# Patient Record
Sex: Male | Born: 1965 | Race: White | Hispanic: No | Marital: Married | Smoking: Never smoker
Health system: Southern US, Community
[De-identification: ages and names within clinical notes are randomized; demographics above are authoritative.]

## PROBLEM LIST (undated history)

## (undated) HISTORY — PX: VASECTOMY: SHX75

---

## 2007-02-04 ENCOUNTER — Emergency Department: Payer: Self-pay | Admitting: Emergency Medicine

## 2008-02-07 DIAGNOSIS — E7800 Pure hypercholesterolemia, unspecified: Secondary | ICD-10-CM | POA: Insufficient documentation

## 2010-05-24 ENCOUNTER — Emergency Department: Payer: Self-pay | Admitting: Emergency Medicine

## 2012-07-20 LAB — TSH: TSH: 1.45 u[IU]/mL (ref 0.41–5.90)

## 2012-07-20 LAB — CBC AND DIFFERENTIAL
HCT: 46 % (ref 41–53)
HEMOGLOBIN: 16.2 g/dL (ref 13.5–17.5)
NEUTROS ABS: 57 /uL
PLATELETS: 246 10*3/uL (ref 150–399)
WBC: 4.9 10*3/mL

## 2012-07-20 LAB — BASIC METABOLIC PANEL
BUN: 14 mg/dL (ref 4–21)
Creatinine: 1 mg/dL (ref 0.6–1.3)
Glucose: 105 mg/dL
Potassium: 4.3 mmol/L (ref 3.4–5.3)
Sodium: 144 mmol/L (ref 137–147)

## 2012-07-20 LAB — HEPATIC FUNCTION PANEL
ALK PHOS: 58 U/L (ref 25–125)
ALT: 48 U/L — AB (ref 10–40)
AST: 34 U/L (ref 14–40)
Bilirubin, Total: 0.6 mg/dL

## 2012-07-20 LAB — LIPID PANEL
CHOLESTEROL: 213 mg/dL — AB (ref 0–200)
HDL: 51 mg/dL (ref 35–70)
LDL CALC: 147 mg/dL
TRIGLYCERIDES: 74 mg/dL (ref 40–160)

## 2015-07-01 DIAGNOSIS — L639 Alopecia areata, unspecified: Secondary | ICD-10-CM | POA: Insufficient documentation

## 2015-07-02 ENCOUNTER — Encounter: Payer: Self-pay | Admitting: Family Medicine

## 2015-07-02 ENCOUNTER — Other Ambulatory Visit (INDEPENDENT_AMBULATORY_CARE_PROVIDER_SITE_OTHER): Payer: BC Managed Care – PPO

## 2015-07-02 ENCOUNTER — Ambulatory Visit (INDEPENDENT_AMBULATORY_CARE_PROVIDER_SITE_OTHER): Payer: BC Managed Care – PPO | Admitting: Family Medicine

## 2015-07-02 VITALS — BP 130/82 | Temp 98.9°F | Resp 16 | Ht 74.0 in | Wt 225.0 lb

## 2015-07-02 DIAGNOSIS — E7800 Pure hypercholesterolemia, unspecified: Secondary | ICD-10-CM

## 2015-07-02 DIAGNOSIS — L639 Alopecia areata, unspecified: Secondary | ICD-10-CM | POA: Diagnosis not present

## 2015-07-02 DIAGNOSIS — Z0000 Encounter for general adult medical examination without abnormal findings: Secondary | ICD-10-CM

## 2015-07-02 LAB — POCT URINALYSIS DIPSTICK
BILIRUBIN UA: NEGATIVE
Glucose, UA: NEGATIVE
Ketones, UA: NEGATIVE
Leukocytes, UA: NEGATIVE
Nitrite, UA: NEGATIVE
PH UA: 7.5
Protein, UA: NEGATIVE
RBC UA: NEGATIVE
SPEC GRAV UA: 1.01
Urobilinogen, UA: 0.2

## 2015-07-02 NOTE — Patient Instructions (Signed)

## 2015-07-02 NOTE — Progress Notes (Signed)
Patient ID: Carl Haney, male   DOB: 03-10-1966, 50 y.o.   MRN: [id number]       Patient: Carl Haney, Male    DOB: 1965/11/30, 50 y.o.   MRN: [id number] Visit Date: 07/02/2015  Today's Provider: Vernie Murders, PA   Chief Complaint  Patient presents with  . Annual Exam  . Numbness    X 4-5 months.    Subjective:    Annual physical exam Carl Haney is a 50 y.o. male who presents today for health maintenance and complete physical. He feels fairly well. He reports exercising not regularly. He reports he is sleeping well.   Tdap- 07/02/2010 Numbness and tingling: Patient reports that he has had numbness and tingling in his left arm for about 1 month. Patient reports that it radiates down to his fingers. Patient denies any chest pain. However, he does report that he has an occasional chest tightness.   Review of Systems  Constitutional: Negative.   HENT: Negative.   Eyes: Negative.   Respiratory: Negative.   Cardiovascular: Negative.   Gastrointestinal: Negative.   Endocrine: Negative.   Genitourinary: Negative.   Musculoskeletal: Negative.   Skin: Negative.   Allergic/Immunologic: Negative.   Neurological: Positive for numbness.  Hematological: Negative.   Psychiatric/Behavioral: Negative.     Social History      He  reports that he has never smoked. He has never used smokeless tobacco. He reports that he drinks alcohol. He reports that he does not use illicit drugs.       Social History   Social History  . Marital Status: Married    Spouse Name: N/A  . Number of Children: N/A  . Years of Education: N/A   Social History Main Topics  . Smoking status: Never Smoker   . Smokeless tobacco: Never Used  . Alcohol Use: 0.0 oz/week    0 Standard drinks or equivalent per week     Comment: Occasionally  . Drug Use: No  . Sexual Activity: Not Asked   Other Topics Concern  . None   Social History Narrative    History reviewed. No pertinent past medical  history.   Patient Active Problem List   Diagnosis Date Noted  . AA (alopecia areata) 07/01/2015  . Pure hypercholesterolemia 02/07/2008  . Family history of sudden cardiac death (SCD) 11-14-2007    Past Surgical History  Procedure Laterality Date  . Vasectomy     Family History        Family Status  Relation Status Death Age  . Mother Alive   . Father Alive   . Sister Alive   . Maternal Grandmother Deceased   . Maternal Grandfather Deceased   . Paternal Grandmother Deceased   . Paternal Grandfather Deceased   . Sister Alive         His family history includes CAD in his father; Dementia in his maternal grandmother; Diabetes in his father; Heart attack in his maternal grandfather and paternal grandfather; Hypertension in his father.    No Known Allergies  Previous Medications   PRAVASTATIN (PRAVACHOL) 40 MG TABLET    Take by mouth. Reported on 07/02/2015    Patient Care Team: Margo Common, PA as PCP - General (Family Medicine)     Objective:   Vitals: BP 130/82 mmHg  Temp(Src) 98.9 F (37.2 C)  Resp 16  Ht 6\' 2"  (1.88 m)  Wt 225 lb (102.059 kg)  BMI 28.88 kg/m2    Physical Exam  Constitutional: He is oriented to person, place, and time. He appears well-developed and well-nourished.  HENT:  Head: Normocephalic and atraumatic.  Right Ear: External ear normal.  Left Ear: External ear normal.  Nose: Nose normal.  Mouth/Throat: Oropharynx is clear and moist.  Eyes: Conjunctivae and EOM are normal. Pupils are equal, round, and reactive to light. Right eye exhibits no discharge.  Neck: Normal range of motion. Neck supple. No tracheal deviation present. No thyromegaly present.  Cardiovascular: Normal rate, regular rhythm, normal heart sounds and intact distal pulses.   No murmur heard. Pulmonary/Chest: Effort normal and breath sounds normal. No respiratory distress. He has no wheezes. He has no rales. He exhibits no tenderness.  Abdominal: Soft. He exhibits  no distension and no mass. There is no tenderness. There is no rebound and no guarding.  Genitourinary: Rectum normal, prostate normal and penis normal. Guaiac negative stool.  Musculoskeletal: Normal range of motion. He exhibits no edema or tenderness.  Lymphadenopathy:    He has no cervical adenopathy.  Neurological: He is alert and oriented to person, place, and time. He has normal reflexes. No cranial nerve deficit. He exhibits normal muscle tone. Coordination normal.  Skin: Skin is warm and dry. No rash noted. No erythema.  Psychiatric: He has a normal mood and affect. His behavior is normal. Judgment and thought content normal.   Depression Screen PHQ 2/9 Scores 07/02/2015  PHQ - 2 Score 0   Assessment & Plan:     Routine Health Maintenance and Physical Exam  Exercise Activities and Dietary recommendations Goals    Walking for an hour with wife. Continue low fat diet.      Immunization History  Administered Date(s) Administered  . Tdap 07/02/2010    Health Maintenance  Topic Date Due  . HIV Screening  07/06/1981  . INFLUENZA VACCINE  11/26/2014  . TETANUS/TDAP  07/01/2020      Discussed health benefits of physical activity, and encouraged him to engage in regular exercise appropriate for his age and condition.    -------------------------------------------------------------------- 1. Annual physical exam Good general health. Immunizations up to date. Given anticipatory guidance about health, future immunizations and colonoscopy next year. Will screen for HIV and get routine labs. - HIV antibody (with reflex)  2. Pure hypercholesterolemia No longer taking the Pravastatin. BMI 28+ and should lose 15 lbs. Continue regular exercise and low fat diet. Recheck labs and follow up pending reports. - CBC with Differential/Platelet - Comprehensive metabolic panel - Lipid panel - TSH  3. AA (alopecia areata) No change in small bald spots on scalp (one on top and one  posterior - approximately 1x1.5 cm each). No discomfort or itch.

## 2015-07-04 ENCOUNTER — Telehealth: Payer: Self-pay

## 2015-07-04 LAB — CBC WITH DIFFERENTIAL/PLATELET
BASOS: 1 %
Basophils Absolute: 0 10*3/uL (ref 0.0–0.2)
EOS (ABSOLUTE): 0.1 10*3/uL (ref 0.0–0.4)
EOS: 3 %
HEMATOCRIT: 43 % (ref 37.5–51.0)
HEMOGLOBIN: 15.8 g/dL (ref 12.6–17.7)
IMMATURE GRANS (ABS): 0 10*3/uL (ref 0.0–0.1)
IMMATURE GRANULOCYTES: 0 %
Lymphocytes Absolute: 1.2 10*3/uL (ref 0.7–3.1)
Lymphs: 23 %
MCH: 31.8 pg (ref 26.6–33.0)
MCHC: 36.7 g/dL — AB (ref 31.5–35.7)
MCV: 87 fL (ref 79–97)
MONOS ABS: 0.4 10*3/uL (ref 0.1–0.9)
Monocytes: 9 %
NEUTROS ABS: 3.4 10*3/uL (ref 1.4–7.0)
NEUTROS PCT: 64 %
Platelets: 256 10*3/uL (ref 150–379)
RBC: 4.97 x10E6/uL (ref 4.14–5.80)
RDW: 13.2 % (ref 12.3–15.4)
WBC: 5.1 10*3/uL (ref 3.4–10.8)

## 2015-07-04 LAB — COMPREHENSIVE METABOLIC PANEL
A/G RATIO: 1.7 (ref 1.1–2.5)
ALBUMIN: 4.5 g/dL (ref 3.5–5.5)
ALT: 30 IU/L (ref 0–44)
AST: 22 IU/L (ref 0–40)
Alkaline Phosphatase: 63 IU/L (ref 39–117)
BUN / CREAT RATIO: 11 (ref 9–20)
BUN: 12 mg/dL (ref 6–24)
Bilirubin Total: 0.9 mg/dL (ref 0.0–1.2)
CALCIUM: 9.4 mg/dL (ref 8.7–10.2)
CO2: 24 mmol/L (ref 18–29)
CREATININE: 1.11 mg/dL (ref 0.76–1.27)
Chloride: 102 mmol/L (ref 96–106)
GFR, EST AFRICAN AMERICAN: 90 mL/min/{1.73_m2} (ref >59)
GFR, EST NON AFRICAN AMERICAN: 78 mL/min/{1.73_m2} (ref >59)
GLOBULIN, TOTAL: 2.6 g/dL (ref 1.5–4.5)
Glucose: 93 mg/dL (ref 65–99)
POTASSIUM: 4.8 mmol/L (ref 3.5–5.2)
SODIUM: 143 mmol/L (ref 134–144)
Total Protein: 7.1 g/dL (ref 6.0–8.5)

## 2015-07-04 LAB — TSH: TSH: 1.11 u[IU]/mL (ref 0.450–4.500)

## 2015-07-04 LAB — LIPID PANEL
CHOL/HDL RATIO: 5.3 ratio — AB (ref 0.0–5.0)
Cholesterol, Total: 234 mg/dL — ABNORMAL HIGH (ref 100–199)
HDL: 44 mg/dL (ref >39)
LDL CALC: 172 mg/dL — AB (ref 0–99)
Triglycerides: 92 mg/dL (ref 0–149)
VLDL Cholesterol Cal: 18 mg/dL (ref 5–40)

## 2015-07-04 LAB — HIV ANTIBODY (ROUTINE TESTING W REFLEX): HIV Screen 4th Generation wRfx: NONREACTIVE

## 2015-07-04 NOTE — Telephone Encounter (Signed)
-----   Message from [place], [place] sent at 07/04/2015  1:42 PM EST ----- All tests normal except total cholesterol and LDL cholesterol higher than last check. Need to be back on the Pravastatin 40 mg qd and recheck progress in 3 months.

## 2015-07-04 NOTE — Telephone Encounter (Signed)
LMTCB

## 2015-07-08 MED ORDER — PRAVASTATIN SODIUM 40 MG PO TABS: 40.0000 mg | ORAL_TABLET | Freq: Every day | ORAL | Status: DC

## 2015-07-08 NOTE — Telephone Encounter (Signed)
Patient advised as directed below. Patient verbalized understanding. Patient requested a refill on Pravastatin be sent to pharmacy. RX sent to [facility].

## 2015-12-04 ENCOUNTER — Other Ambulatory Visit: Payer: Self-pay | Admitting: Family Medicine

## 2015-12-04 NOTE — Telephone Encounter (Signed)
Patient requesting refill. Patient is due for recheck on labs. Last labs 07/03/15 and was recommended to recheck in 3 months.

## 2015-12-04 NOTE — Telephone Encounter (Signed)
Pt contacted office for refill request on the following medications: PRAVASTATIN 40MG  TABLETS.  [place].  CB#[phone]/MW

## 2015-12-05 MED ORDER — PRAVASTATIN SODIUM 40 MG PO TABS: 40.0000 mg | ORAL_TABLET | Freq: Every day | ORAL | 0 refills | Status: DC

## 2016-01-16 ENCOUNTER — Telehealth: Payer: Self-pay | Admitting: Family Medicine

## 2016-01-16 NOTE — Telephone Encounter (Signed)
Please review-aa 

## 2016-01-16 NOTE — Telephone Encounter (Signed)
Pt contacted office for refill request on the following medications:  pravastatin (PRAVACHOL) 40 MG tablet.  [place].  CB#[phone]/MW

## 2016-01-17 ENCOUNTER — Other Ambulatory Visit: Payer: Self-pay | Admitting: Family Medicine

## 2016-01-17 MED ORDER — PRAVASTATIN SODIUM 40 MG PO TABS: 40.0000 mg | ORAL_TABLET | Freq: Every day | ORAL | 6 refills | Status: DC

## 2016-01-17 NOTE — Telephone Encounter (Signed)
Refilled medications. Remind patient he is due for follow up of labs to assess progress.

## 2016-01-17 NOTE — Telephone Encounter (Signed)
Wife advised to let patient know-aa

## 2016-09-08 ENCOUNTER — Encounter: Payer: Self-pay | Admitting: Family Medicine

## 2016-09-08 ENCOUNTER — Ambulatory Visit (INDEPENDENT_AMBULATORY_CARE_PROVIDER_SITE_OTHER): Payer: BC Managed Care – PPO | Admitting: Family Medicine

## 2016-09-08 VITALS — BP 126/82 | HR 84 | Temp 98.2°F | Resp 16 | Ht 73.0 in | Wt 231.0 lb

## 2016-09-08 DIAGNOSIS — E7800 Pure hypercholesterolemia, unspecified: Secondary | ICD-10-CM | POA: Diagnosis not present

## 2016-09-08 DIAGNOSIS — Z1211 Encounter for screening for malignant neoplasm of colon: Secondary | ICD-10-CM | POA: Diagnosis not present

## 2016-09-08 DIAGNOSIS — M545 Low back pain, unspecified: Secondary | ICD-10-CM

## 2016-09-08 DIAGNOSIS — Z0000 Encounter for general adult medical examination without abnormal findings: Secondary | ICD-10-CM | POA: Diagnosis not present

## 2016-09-08 DIAGNOSIS — Z125 Encounter for screening for malignant neoplasm of prostate: Secondary | ICD-10-CM

## 2016-09-08 DIAGNOSIS — Z8241 Family history of sudden cardiac death: Secondary | ICD-10-CM | POA: Diagnosis not present

## 2016-09-08 MED ORDER — CYCLOBENZAPRINE HCL 5 MG PO TABS
5.0000 mg | ORAL_TABLET | Freq: Three times a day (TID) | ORAL | 1 refills | Status: DC | PRN
Start: 2016-09-08 — End: 2017-10-11

## 2016-09-08 NOTE — Progress Notes (Signed)
Patient: Carl Haney, Male    DOB: [birth year], 51 y.o.   MRN: [id number] Visit Date: 09/08/2016  Today's Provider: Vernie Murders, PA   Chief Complaint  Patient presents with  . Annual Exam   Subjective:    Annual physical exam Carl Haney is a 51 y.o. male who presents today for health maintenance and complete physical. He feels fairly well. He is c/o lower right back pain that radiates into his right leg x 3 weeks. He reports exercising once weekly for 1 hour; walks. He reports he is sleeping well.  Tdap- 07/02/2010 Due for colonoscopy  -----------------------------------------------------------------  Lipid/Cholesterol, Follow-up:   Last seen for this 1 years ago.  Management changes since that visit include restarting Pravastatin 40 mg. . Last Lipid Panel:    Component Value Date/Time   CHOL 234 (H) 07/03/2015 1418   TRIG 92 07/03/2015 1418   HDL 44 07/03/2015 1418   CHOLHDL 5.3 (H) 07/03/2015 1418   LDLCALC 172 (H) 07/03/2015 1418    He reports excellent compliance with treatment. He is not having side effects.  Weight trend: increasing steadily Current diet: in general, a "healthy" diet   Current exercise: walking   Wt Readings from Last 3 Encounters:  09/08/16 231 lb (104.8 kg)  07/02/15 225 lb (102.1 kg)    -------------------------------------------------------------------   Review of Systems  Constitutional: Negative.   HENT: Negative.   Eyes: Negative.   Respiratory: Negative.   Cardiovascular: Negative.   Gastrointestinal: Negative.   Endocrine: Negative.   Genitourinary: Negative.   Musculoskeletal: Positive for back pain (right lower back pain x 3 weeks; radiates into right leg). Negative for arthralgias, gait problem, joint swelling, myalgias, neck pain and neck stiffness.  Skin: Negative.   Allergic/Immunologic: Negative.   Neurological: Negative.   Hematological: Negative.   Psychiatric/Behavioral: Negative.     Social  History      He  reports that he has never smoked. He has never used smokeless tobacco. He reports that he drinks alcohol. He reports that he does not use drugs.       Social History   Social History  . Marital status: Married    Spouse name: Museum/gallery conservator  . Number of children: 2  . Years of education: college   Occupational History  .  [place] History Main Topics  . Smoking status: Never Smoker  . Smokeless tobacco: Never Used  . Alcohol use 0.0 oz/week     Comment: Occasionally  . Drug use: No  . Sexual activity: Yes   Other Topics Concern  . None   Social History Narrative  . None   History reviewed. No pertinent past medical history.  Patient Active Problem List   Diagnosis Date Noted  . AA (alopecia areata) 07/01/2015  . Pure hypercholesterolemia 02/07/2008  . Family history of sudden cardiac death (SCD) Nov 24, 2007    Past Surgical History:  Procedure Laterality Date  . VASECTOMY      Family History        Family Status  Relation Status  . Mother Alive  . Father Alive  . Sister Alive  . MGM Deceased  . MGF Deceased  . PGM Deceased  . PGF Deceased  . Sister Alive        His family history includes CAD in his father; Dementia in his maternal grandmother; Diabetes in his father; Healthy in his mother, sister, and sister; Heart attack in his maternal grandfather  and paternal grandfather; Hypertension in his father.     No Known Allergies   Current Outpatient Prescriptions:  .  pravastatin (PRAVACHOL) 40 MG tablet, Take 1 tablet (40 mg total) by mouth daily. DUE FOR RECHECK OF BLOOD CHEMISTRY AND CHOLESTEROL., Disp: 30 tablet, Rfl: 6   Patient Care Team: Samiha Denapoli, Vickki Muff, PA as PCP - General (Family Medicine)      Objective:   Vitals: BP 126/82 (BP Location: Right Arm, Patient Position: Sitting, Cuff Size: Large)   Pulse 84   Temp 98.2 F (36.8 C) (Oral)   Resp 16   Ht 6\' 1"  (1.854 m)   Wt 231 lb (104.8 kg)   BMI 30.48 kg/m     Vitals:   09/08/16 1512  BP: 126/82  Pulse: 84  Resp: 16  Temp: 98.2 F (36.8 C)  TempSrc: Oral  Weight: 231 lb (104.8 kg)  Height: 6\' 1"  (1.854 m)     Physical Exam  Constitutional: He is oriented to person, place, and time. He appears well-developed and well-nourished.  HENT:  Head: Normocephalic and atraumatic.  Right Ear: External ear normal.  Left Ear: External ear normal.  Nose: Nose normal.  Mouth/Throat: Oropharynx is clear and moist.  Eyes: Conjunctivae and EOM are normal. Pupils are equal, round, and reactive to light. Right eye exhibits no discharge.  Neck: Normal range of motion. Neck supple. No tracheal deviation present. No thyromegaly present.  Cardiovascular: Normal rate, regular rhythm, normal heart sounds and intact distal pulses.   No murmur heard. Pulmonary/Chest: Effort normal and breath sounds normal. No respiratory distress. He has no wheezes. He has no rales. He exhibits no tenderness.  Abdominal: Soft. He exhibits no distension and no mass. There is no tenderness. There is no rebound and no guarding.  Genitourinary: Rectum normal, prostate normal and penis normal.  Musculoskeletal: Normal range of motion. He exhibits tenderness. He exhibits no edema.  Tenderness in the right lower lumbar region with radiation to buttock. No numbness or weakness in legs. Increased pain and spasm with SLR's to 80 degrees (worse with dorsiflexion of right foot).  Lymphadenopathy:    He has no cervical adenopathy.  Neurological: He is alert and oriented to person, place, and time. No cranial nerve deficit. He exhibits normal muscle tone. Coordination normal.  DTR's diminished throughout upper and lower extremities.  Skin: Skin is warm and dry. No rash noted. No erythema.  Psychiatric: He has a normal mood and affect. His behavior is normal. Judgment and thought content normal.    Depression Screen PHQ 2/9 Scores 09/08/2016 07/02/2015  PHQ - 2 Score 0 0  PHQ- 9 Score 0 -     Assessment & Plan:     Routine Health Maintenance and Physical Exam  Exercise Activities and Dietary recommendations Goals    Walking 3-4 times a week for 30 minutes as back spasms allow.      Immunization History  Administered Date(s) Administered  . Td 02/04/2007  . Tdap 07/02/2010    Health Maintenance  Topic Date Due  . COLONOSCOPY  07/06/2016  . INFLUENZA VACCINE  11/25/2016  . TETANUS/TDAP  07/01/2020  . HIV Screening  Completed     Discussed health benefits of physical activity, and encouraged him to engage in regular exercise appropriate for his age and condition.    -------------------------------------------------------------------- 1. Annual physical exam Stable and good general health except weight gain. Recommend weight loss by regular exercise and calorie reduction diet. Immunizations up to date. Given anticipatory  guidance. - CBC with Differential/Platelet - Comprehensive metabolic panel  2. Colon cancer screening No hematochezia, melena or abdominal pains. - Ambulatory referral to Gastroenterology  3. Screening PSA (prostate specific antigen) Slight dribbling after urinating. No nocturia of significance. Check PSA. - PSA  4. Pure hypercholesterolemia Tolerating Pravastatin 40 mg qd and low fat diet. Has gained 6 lbs since last office visit. Recheck labs. - Comprehensive metabolic panel - Lipid panel - TSH  5. Family history of sudden cardiac death (SCD) EKG shows normal sinus rhythm. No signs of acute changes. Denies chest pains or palpitations. Sudden heart attacks in his MGF and PGF. - EKG 12-Lead  6. Acute right-sided low back pain without sciatica Right lower lumbar muscle spasms over the past 3 weeks. No specific injury known. No numbness or pain radiating into the right lower leg or foot. Apply moist heat and given Flexeril prn spasm. May need x-ray evaluation and physical therapy if no better in 5-7 days. - cyclobenzaprine (FLEXERIL)  5 MG tablet; Take 1 tablet (5 mg total) by mouth 3 (three) times daily as needed for muscle spasms.  Dispense: 30 tablet; Refill: [place], [facility]

## 2016-09-10 LAB — CBC WITH DIFFERENTIAL/PLATELET
BASOS ABS: 0 10*3/uL (ref 0.0–0.2)
Basos: 1 %
EOS (ABSOLUTE): 0.2 10*3/uL (ref 0.0–0.4)
EOS: 5 %
HEMATOCRIT: 43.3 % (ref 37.5–51.0)
HEMOGLOBIN: 15.3 g/dL (ref 13.0–17.7)
IMMATURE GRANS (ABS): 0 10*3/uL (ref 0.0–0.1)
Immature Granulocytes: 0 %
LYMPHS: 24 %
Lymphocytes Absolute: 1.1 10*3/uL (ref 0.7–3.1)
MCH: 31 pg (ref 26.6–33.0)
MCHC: 35.3 g/dL (ref 31.5–35.7)
MCV: 88 fL (ref 79–97)
MONOCYTES: 10 %
Monocytes Absolute: 0.4 10*3/uL (ref 0.1–0.9)
Neutrophils Absolute: 2.7 10*3/uL (ref 1.4–7.0)
Neutrophils: 60 %
Platelets: 236 10*3/uL (ref 150–379)
RBC: 4.94 x10E6/uL (ref 4.14–5.80)
RDW: 14.2 % (ref 12.3–15.4)
WBC: 4.4 10*3/uL (ref 3.4–10.8)

## 2016-09-10 LAB — COMPREHENSIVE METABOLIC PANEL
ALBUMIN: 4.5 g/dL (ref 3.5–5.5)
ALK PHOS: 59 IU/L (ref 39–117)
ALT: 24 IU/L (ref 0–44)
AST: 22 IU/L (ref 0–40)
Albumin/Globulin Ratio: 1.7 (ref 1.2–2.2)
BUN / CREAT RATIO: 10 (ref 9–20)
BUN: 11 mg/dL (ref 6–24)
Bilirubin Total: 1 mg/dL (ref 0.0–1.2)
CALCIUM: 9.4 mg/dL (ref 8.7–10.2)
CO2: 24 mmol/L (ref 18–29)
CREATININE: 1.05 mg/dL (ref 0.76–1.27)
Chloride: 106 mmol/L (ref 96–106)
GFR, EST AFRICAN AMERICAN: 95 mL/min/{1.73_m2} (ref >59)
GFR, EST NON AFRICAN AMERICAN: 82 mL/min/{1.73_m2} (ref >59)
GLOBULIN, TOTAL: 2.6 g/dL (ref 1.5–4.5)
GLUCOSE: 99 mg/dL (ref 65–99)
Potassium: 4.5 mmol/L (ref 3.5–5.2)
Sodium: 145 mmol/L — ABNORMAL HIGH (ref 134–144)
TOTAL PROTEIN: 7.1 g/dL (ref 6.0–8.5)

## 2016-09-10 LAB — TSH: TSH: 1.32 u[IU]/mL (ref 0.450–4.500)

## 2016-09-10 LAB — LIPID PANEL
CHOL/HDL RATIO: 4.7 ratio (ref 0.0–5.0)
CHOLESTEROL TOTAL: 174 mg/dL (ref 100–199)
HDL: 37 mg/dL — ABNORMAL LOW (ref >39)
LDL CALC: 121 mg/dL — AB (ref 0–99)
Triglycerides: 82 mg/dL (ref 0–149)
VLDL CHOLESTEROL CAL: 16 mg/dL (ref 5–40)

## 2016-09-10 LAB — PSA: Prostate Specific Ag, Serum: 0.8 ng/mL (ref 0.0–4.0)

## 2016-09-14 ENCOUNTER — Other Ambulatory Visit: Payer: Self-pay | Admitting: Family Medicine

## 2016-09-14 ENCOUNTER — Telehealth: Payer: Self-pay

## 2016-09-14 MED ORDER — PRAVASTATIN SODIUM 40 MG PO TABS: 40.0000 mg | ORAL_TABLET | Freq: Every day | ORAL | 2 refills | Status: DC

## 2016-09-14 NOTE — Telephone Encounter (Signed)
Last OV 09/08/2016. Renaldo Fiddler, CMA

## 2016-09-14 NOTE — Telephone Encounter (Signed)
-----   Message from Margo Common, [place] sent at 09/11/2016  1:32 PM EDT ----- LDL cholesterol better but still some elevation. Continue Pravastatin 40 mg qd and low fat diet. Remainder of blood tests are normal. Recheck cholesterol progress in 6 months.

## 2016-09-14 NOTE — Telephone Encounter (Signed)
Left message advising pt; ok per DPR. Tiaunna Buford Drozdowski, CMA  

## 2016-09-14 NOTE — Telephone Encounter (Signed)
Refill  pravastatin (PRAVACHOL) 40 MG tablet  [facility] [address]  Thank sTeri

## 2016-10-05 ENCOUNTER — Telehealth: Payer: Self-pay | Admitting: Gastroenterology

## 2016-10-05 ENCOUNTER — Other Ambulatory Visit: Payer: Self-pay

## 2016-10-05 DIAGNOSIS — Z1211 Encounter for screening for malignant neoplasm of colon: Secondary | ICD-10-CM

## 2016-10-05 NOTE — Telephone Encounter (Signed)
Gastroenterology Pre-Procedure Review  Request Date: 6/26 Requesting Physician: Dr. Vicente Males  PATIENT REVIEW QUESTIONS: The patient responded to the following health history questions as indicated:    1. Are you having any GI issues? no 2. Do you have a personal history of Polyps? no 3. Do you have a family history of Colon Cancer or Polyps? no 4. Diabetes Mellitus? no 5. Joint replacements in the past 12 months?no 6. Major health problems in the past 3 months?no 7. Any artificial heart valves, MVP, or defibrillator?no    MEDICATIONS & ALLERGIES:    Patient reports the following regarding taking any anticoagulation/antiplatelet therapy:   Plavix, Coumadin, Eliquis, Xarelto, Lovenox, Pradaxa, Brilinta, or Effient? no Aspirin? no  Patient confirms/reports the following medications:  Current Outpatient Prescriptions  Medication Sig Dispense Refill  . cyclobenzaprine (FLEXERIL) 5 MG tablet Take 1 tablet (5 mg total) by mouth 3 (three) times daily as needed for muscle spasms. 30 tablet 1  . pravastatin (PRAVACHOL) 40 MG tablet Take 1 tablet (40 mg total) by mouth daily. 30 tablet 2   No current facility-administered medications for this visit.     Patient confirms/reports the following allergies:  No Known Allergies  No orders of the defined types were placed in this encounter.   AUTHORIZATION INFORMATION Primary Insurance: 1D#: Group #:  Secondary Insurance: 1D#: Group #:  SCHEDULE INFORMATION: Date: 6/26 Time: Location: [place]

## 2016-10-05 NOTE — Telephone Encounter (Signed)
Patient is returning your call to schedule a colonoscopy °

## 2016-10-20 ENCOUNTER — Encounter: Payer: Self-pay | Admitting: *Deleted

## 2016-10-20 ENCOUNTER — Ambulatory Visit: Payer: BC Managed Care – PPO | Admitting: Anesthesiology

## 2016-10-20 ENCOUNTER — Encounter
Admission: RE | Disposition: A | Discharge status: Home / Self Care | Payer: Self-pay | Source: Ambulatory Visit | Attending: Gastroenterology

## 2016-10-20 ENCOUNTER — Ambulatory Visit
Admission: RE | Admit: 2016-10-20 | Discharge: 2016-10-20 | Disposition: A | Discharge status: Home / Self Care | Payer: BC Managed Care – PPO | Source: Ambulatory Visit | Attending: Gastroenterology | Admitting: Gastroenterology

## 2016-10-20 DIAGNOSIS — Z820 Family history of epilepsy and other diseases of the nervous system: Secondary | ICD-10-CM | POA: Insufficient documentation

## 2016-10-20 DIAGNOSIS — Z823 Family history of stroke: Secondary | ICD-10-CM | POA: Diagnosis not present

## 2016-10-20 DIAGNOSIS — Z79899 Other long term (current) drug therapy: Secondary | ICD-10-CM | POA: Diagnosis not present

## 2016-10-20 DIAGNOSIS — K640 First degree hemorrhoids: Secondary | ICD-10-CM | POA: Insufficient documentation

## 2016-10-20 DIAGNOSIS — Z1211 Encounter for screening for malignant neoplasm of colon: Secondary | ICD-10-CM | POA: Insufficient documentation

## 2016-10-20 DIAGNOSIS — Z8249 Family history of ischemic heart disease and other diseases of the circulatory system: Secondary | ICD-10-CM | POA: Insufficient documentation

## 2016-10-20 DIAGNOSIS — D125 Benign neoplasm of sigmoid colon: Secondary | ICD-10-CM | POA: Diagnosis not present

## 2016-10-20 DIAGNOSIS — Z833 Family history of diabetes mellitus: Secondary | ICD-10-CM | POA: Diagnosis not present

## 2016-10-20 HISTORY — PX: COLONOSCOPY WITH PROPOFOL: SHX5780

## 2016-10-20 SURGERY — COLONOSCOPY WITH PROPOFOL
Anesthesia: General

## 2016-10-20 MED ORDER — SODIUM CHLORIDE 0.9 % IV SOLN
INTRAVENOUS | Status: DC
  Administered 2016-10-20 (×2): 1000 mL via INTRAVENOUS

## 2016-10-20 MED ORDER — PROPOFOL 500 MG/50ML IV EMUL
INTRAVENOUS | Status: DC | PRN
  Administered 2016-10-20: 140 ug/kg/min via INTRAVENOUS

## 2016-10-20 MED ORDER — EPHEDRINE SULFATE 50 MG/ML IJ SOLN
INTRAMUSCULAR | Status: DC | PRN
  Administered 2016-10-20: 10 mg via INTRAVENOUS

## 2016-10-20 MED ORDER — MIDAZOLAM HCL 2 MG/2ML IJ SOLN
INTRAMUSCULAR | Status: AC
  Filled 2016-10-20: qty 2

## 2016-10-20 MED ORDER — FENTANYL CITRATE (PF) 100 MCG/2ML IJ SOLN
INTRAMUSCULAR | Status: AC
  Filled 2016-10-20: qty 2

## 2016-10-20 MED ORDER — PROPOFOL 500 MG/50ML IV EMUL
INTRAVENOUS | Status: AC
  Filled 2016-10-20: qty 50

## 2016-10-20 MED ORDER — FENTANYL CITRATE (PF) 100 MCG/2ML IJ SOLN
INTRAMUSCULAR | Status: DC | PRN
  Administered 2016-10-20 (×2): 50 ug via INTRAVENOUS

## 2016-10-20 MED ORDER — MIDAZOLAM HCL 2 MG/2ML IJ SOLN
INTRAMUSCULAR | Status: DC | PRN
  Administered 2016-10-20: 2 mg via INTRAVENOUS

## 2016-10-20 NOTE — Anesthesia Procedure Notes (Signed)
Performed by: COOK-MARTIN, Lanetra Hartley Pre-anesthesia Checklist: Patient identified, Emergency Drugs available, Suction available, Patient being monitored and Timeout performed Patient Re-evaluated:Patient Re-evaluated prior to inductionOxygen Delivery Method: Nasal cannula Preoxygenation: Pre-oxygenation with 100% oxygen Intubation Type: IV induction Placement Confirmation: positive ETCO2 and CO2 detector       

## 2016-10-20 NOTE — Transfer of Care (Signed)
Immediate Anesthesia Transfer of Care Note  Patient: Carl Haney  Procedure(s) Performed: Procedure(s): COLONOSCOPY WITH PROPOFOL (N/A)  Patient Location: PACU  Anesthesia Type:General  Level of Consciousness: awake and sedated  Airway & Oxygen Therapy: Patient Spontanous Breathing and Patient connected to nasal cannula oxygen  Post-op Assessment: Report given to RN and Post -op Vital signs reviewed and stable  Post vital signs: Reviewed and stable  Last Vitals:  Vitals:   10/20/16 0946  BP: (!) 135/94  Pulse: 65  Resp: 18  Temp: (!) 36.1 C    Last Pain:  Vitals:   10/20/16 0946  TempSrc: Tympanic         Complications: No apparent anesthesia complications

## 2016-10-20 NOTE — Anesthesia Post-op Follow-up Note (Cosign Needed)
Anesthesia QCDR form completed.        

## 2016-10-20 NOTE — Anesthesia Postprocedure Evaluation (Signed)
Anesthesia Post Note  Patient: Carl Haney  Procedure(s) Performed: Procedure(s) (LRB): COLONOSCOPY WITH PROPOFOL (N/A)  Patient location during evaluation: PACU Anesthesia Type: General Level of consciousness: awake Pain management: pain level controlled Vital Signs Assessment: post-procedure vital signs reviewed and stable Respiratory status: nonlabored ventilation Cardiovascular status: stable Anesthetic complications: no     Last Vitals:  Vitals:   10/20/16 0946 10/20/16 1107  BP: (!) 135/94   Pulse: 65   Resp: 18   Temp: (!) 36.1 C 36.1 C    Last Pain:  Vitals:   10/20/16 1107  TempSrc: Tympanic                 VAN STAVEREN,[name]

## 2016-10-20 NOTE — Op Note (Signed)
[facility] Gastroenterology Patient Name: Carl Haney Procedure Date: 10/20/2016 10:35 AM MRN: [id number] Account #: [id number] Date of Birth: 02/15/1966 Admit Type: Outpatient Age: 51 Room: [facility] ENDO ROOM 1 Gender: Male Note Status: Finalized Procedure:            Colonoscopy Indications:          Screening for colorectal malignant neoplasm Providers:            Jonathon Bellows MD, MD Referring MD:         Vickki Muff. Chrismon, MD (Referring MD) Medicines:            Monitored Anesthesia Care Complications:        No immediate complications. Procedure:            Pre-Anesthesia Assessment:                       - Prior to the procedure, a History and Physical was                        performed, and patient medications, allergies and                        sensitivities were reviewed. The patient's tolerance of                        previous anesthesia was reviewed.                       - The risks and benefits of the procedure and the                        sedation options and risks were discussed with the                        patient. All questions were answered and informed                        consent was obtained.                       - ASA Grade Assessment: I - A normal, healthy patient.                       After obtaining informed consent, the colonoscope was                        passed under direct vision. Throughout the procedure,                        the patient's blood pressure, pulse, and oxygen                        saturations were monitored continuously. The                        Colonoscope was introduced through the anus and                        advanced to the the cecum, identified by the  appendiceal orifice, IC valve and transillumination.                        The colonoscopy was performed with ease. The patient                        tolerated the procedure well. The quality of the bowel         preparation was good. Findings:      A 8 mm polyp was found in the sigmoid colon. The polyp was sessile. The       polyp was removed with a cold snare. Resection and retrieval were       complete.      Non-bleeding internal hemorrhoids were found during retroflexion. The       hemorrhoids were medium-sized and Grade I (internal hemorrhoids that do       not prolapse).      The exam was otherwise without abnormality on direct and retroflexion       views. Impression:           - One 8 mm polyp in the sigmoid colon, removed with a                        cold snare. Resected and retrieved.                       - Non-bleeding internal hemorrhoids.                       - The examination was otherwise normal on direct and                        retroflexion views. Recommendation:       - Discharge patient to home (with escort).                       - Resume previous diet.                       - Continue present medications.                       - Await pathology results.                       - Repeat colonoscopy in 5-10 years for surveillance. Procedure Code(s):    --- Professional ---                       [phone], Colonoscopy, flexible; with removal of tumor(s),                        polyp(s), or other lesion(s) by snare technique Diagnosis Code(s):    --- Professional ---                       Z12.11, Encounter for screening for malignant neoplasm                        of colon                       D12.5, Benign neoplasm of sigmoid colon  K64.0, First degree hemorrhoids CPT copyright 2016 American Medical Association. All rights reserved. The codes documented in this report are preliminary and upon coder review may  be revised to meet current compliance requirements. Jonathon Bellows, MD Jonathon Bellows MD, MD 10/20/2016 11:06:31 AM This report has been signed electronically. Number of Addenda: 0 Note Initiated On: 10/20/2016 10:35 AM Scope Withdrawal Time: 0  hours 13 minutes 41 seconds  Total Procedure Duration: 0 hours 21 minutes 22 seconds       [facility]

## 2016-10-20 NOTE — Anesthesia Preprocedure Evaluation (Signed)
Anesthesia Evaluation  Patient identified by MRN, date of birth, ID band Patient awake    Reviewed: Allergy & Precautions, H&P , NPO status , Patient's Chart, lab work & pertinent test results  History of Anesthesia Complications Negative for: history of anesthetic complications  Airway Mallampati: III  TM Distance: <3 FB Neck ROM: full    Dental  (+) Chipped   Pulmonary neg pulmonary ROS, neg shortness of breath,           Cardiovascular Exercise Tolerance: Good (-) angina(-) Past MI negative cardio ROS       Neuro/Psych negative neurological ROS  negative psych ROS   GI/Hepatic negative GI ROS, Neg liver ROS,   Endo/Other  negative endocrine ROS  Renal/GU negative Renal ROS  negative genitourinary   Musculoskeletal   Abdominal   Peds  Hematology negative hematology ROS (+)   Anesthesia Other Findings History reviewed. No pertinent past medical history.  Past Surgical History: No date: VASECTOMY  BMI    Body Mass Index:  28.89 kg/m      Reproductive/Obstetrics negative OB ROS                             Anesthesia Physical Anesthesia Plan  ASA: I  Anesthesia Plan: General   Post-op Pain Management:    Induction: Intravenous  PONV Risk Score and Plan:   Airway Management Planned: Natural Airway and Nasal Cannula  Additional Equipment:   Intra-op Plan:   Post-operative Plan:   Informed Consent: I have reviewed the patients History and Physical, chart, labs and discussed the procedure including the risks, benefits and alternatives for the proposed anesthesia with the patient or authorized representative who has indicated his/her understanding and acceptance.   Dental Advisory Given  Plan Discussed with: Anesthesiologist, CRNA and Surgeon  Anesthesia Plan Comments: (Patient consented for risks of anesthesia including but not limited to:  - adverse reactions to  medications - damage to teeth, lips or other oral mucosa - sore throat or hoarseness - Damage to heart, brain, lungs or loss of life  Patient voiced understanding.)        Anesthesia Quick Evaluation

## 2016-10-20 NOTE — H&P (Signed)
  Jonathon Bellows MD [address], [place], [place] [address] Phone: [phone] Fax : [phone]  Primary Care Physician:  Margo Common, [place] Primary Gastroenterologist:  Dr. Jonathon Bellows   Pre-Procedure History & Physical: HPI:  Carl Haney is a 51 y.o. male is here for an colonoscopy.   History reviewed. No pertinent past medical history.  Past Surgical History:  Procedure Laterality Date  . VASECTOMY      Prior to Admission medications   Medication Sig Start Date End Date Taking? Authorizing Provider  cyclobenzaprine (FLEXERIL) 5 MG tablet Take 1 tablet (5 mg total) by mouth 3 (three) times daily as needed for muscle spasms. 09/08/16  Yes Chrismon, Vickki Muff, PA  pravastatin (PRAVACHOL) 40 MG tablet Take 1 tablet (40 mg total) by mouth daily. 09/14/16  Yes Birdie Sons, MD    Allergies as of 10/05/2016  . (No Known Allergies)    Family History  Problem Relation Age of Onset  . Healthy Mother   . CAD Father   . Diabetes Father   . Hypertension Father   . Healthy Sister   . Dementia Maternal Grandmother   . Heart attack Maternal Grandfather   . Heart attack Paternal Grandfather   . Healthy Sister     Social History   Social History  . Marital status: Married    Spouse name: Museum/gallery conservator  . Number of children: 2  . Years of education: college   Occupational History  .  [place] History Main Topics  . Smoking status: Never Smoker  . Smokeless tobacco: Never Used  . Alcohol use 0.0 oz/week     Comment: Occasionally  . Drug use: No  . Sexual activity: Yes   Other Topics Concern  . Not on file   Social History Narrative  . No narrative on file    Review of Systems: See HPI, otherwise negative ROS  Physical Exam: BP (!) 135/94   Pulse 65   Temp (!) 96.9 F (36.1 C) (Tympanic)   Resp 18   Ht 6\' 2"  (1.88 m)   Wt 225 lb (102.1 kg)   SpO2 99%   BMI 28.89 kg/m  General:   Alert,  pleasant and cooperative in NAD Head:   Normocephalic and atraumatic. Neck:  Supple; no masses or thyromegaly. Lungs:  Clear throughout to auscultation.    Heart:  Regular rate and rhythm. Abdomen:  Soft, nontender and nondistended. Normal bowel sounds, without guarding, and without rebound.   Neurologic:  Alert and  oriented x4;  grossly normal neurologically.  Impression/Plan: Terese Door is here for an colonoscopy to be performed for Screening colonoscopy average risk    Risks, benefits, limitations, and alternatives regarding  colonoscopy have been reviewed with the patient.  Questions have been answered.  All parties agreeable.   Jonathon Bellows, MD  10/20/2016, 10:34 AM

## 2016-10-21 ENCOUNTER — Encounter: Payer: Self-pay | Admitting: Gastroenterology

## 2016-10-21 LAB — SURGICAL PATHOLOGY

## 2016-10-25 ENCOUNTER — Encounter: Payer: Self-pay | Admitting: Gastroenterology

## 2017-01-05 ENCOUNTER — Other Ambulatory Visit: Payer: Self-pay | Admitting: Family Medicine

## 2017-03-19 ENCOUNTER — Encounter: Payer: Self-pay | Admitting: Family Medicine

## 2017-03-19 ENCOUNTER — Ambulatory Visit: Payer: BC Managed Care – PPO | Admitting: Family Medicine

## 2017-03-19 VITALS — BP 128/78 | HR 71 | Temp 98.2°F | Wt 233.0 lb

## 2017-03-19 DIAGNOSIS — E7800 Pure hypercholesterolemia, unspecified: Secondary | ICD-10-CM

## 2017-03-19 NOTE — Progress Notes (Signed)
Patient: Carl Haney Male    DOB: August 06, 1965   51 y.o.   MRN: [id number] Visit Date: 03/19/2017  Today's Provider: Vernie Murders, PA   Chief Complaint  Patient presents with  . Hyperlipidemia  . Follow-up   Subjective:    HPI  Lipid/Cholesterol, Follow-up:   Last seen for this 6 months ago.  Management changes since that visit include continue medication and low fat diet. . Last Lipid Panel:    Component Value Date/Time   CHOL 174 09/09/2016 0812   TRIG 82 09/09/2016 0812   HDL 37 (L) 09/09/2016 0812   CHOLHDL 4.7 09/09/2016 0812   LDLCALC 121 (H) 09/09/2016 8921    He reports fair compliance with treatment. He is not having side effects.  Current symptoms include none  Weight trend: increasing steadily Current diet: in general, an "unhealthy" diet Current exercise: not as much as last follow up   Wt Readings from Last 3 Encounters:  03/19/17 233 lb (105.7 kg)  10/20/16 225 lb (102.1 kg)  09/08/16 231 lb (104.8 kg)    -------------------------------------------------------------------  Patient Active Problem List   Diagnosis Date Noted  . AA (alopecia areata) 07/01/2015  . Pure hypercholesterolemia 02/07/2008  . Family history of sudden cardiac death (SCD) 11/12/2007   Past Surgical History:  Procedure Laterality Date  . COLONOSCOPY WITH PROPOFOL N/A 10/20/2016   Procedure: COLONOSCOPY WITH PROPOFOL;  Surgeon: Jonathon Bellows, MD;  Location: [facility] ENDOSCOPY;  Service: Endoscopy;  Laterality: N/A;  . VASECTOMY     Family History  Problem Relation Age of Onset  . Healthy Mother   . CAD Father   . Diabetes Father   . Hypertension Father   . Healthy Sister   . Dementia Maternal Grandmother   . Heart attack Maternal Grandfather   . Heart attack Paternal Grandfather   . Healthy Sister    No Known Allergies  Current Outpatient Medications:  .  cyclobenzaprine (FLEXERIL) 5 MG tablet, Take 1 tablet (5 mg total) by mouth 3 (three) times daily as  needed for muscle spasms., Disp: 30 tablet, Rfl: 1 .  pravastatin (PRAVACHOL) 40 MG tablet, TAKE 1 TABLET(40 MG) BY MOUTH DAILY, Disp: 30 tablet, Rfl: 3  Review of Systems  Constitutional: Negative.   Respiratory: Negative.   Cardiovascular: Negative.   Musculoskeletal: Negative.     Social History   Tobacco Use  . Smoking status: Never Smoker  . Smokeless tobacco: Never Used  Substance Use Topics  . Alcohol use: Yes    Alcohol/week: 0.0 oz    Comment: Occasionally   Objective:   BP 128/78 (BP Location: Right Arm, Patient Position: Sitting, Cuff Size: Normal)   Pulse 71   Temp 98.2 F (36.8 C) (Oral)   Wt 233 lb (105.7 kg)   SpO2 97%   BMI 29.92 kg/m   Physical Exam  Constitutional: He is oriented to person, place, and time. He appears well-developed and well-nourished. No distress.  HENT:  Head: Normocephalic and atraumatic.  Right Ear: Hearing normal.  Left Ear: Hearing normal.  Nose: Nose normal.  Eyes: Conjunctivae and lids are normal. Right eye exhibits no discharge. Left eye exhibits no discharge. No scleral icterus.  Neck: Neck supple.  Cardiovascular: Normal rate and regular rhythm.  Pulmonary/Chest: Effort normal and breath sounds normal. No respiratory distress.  Abdominal: Soft. Bowel sounds are normal.  Musculoskeletal: Normal range of motion.  Neurological: He is alert and oriented to person, place, and time.  Skin:  Skin is intact. No lesion and no rash noted.  Psychiatric: He has a normal mood and affect. His speech is normal and behavior is normal. Thought content normal.      Assessment & Plan:     1. Pure hypercholesterolemia Tolerating Pravastatin 40 mg qd without side effects. Trying to follow a low fat diet but has gained 8 lbs since last visit. Encouraged to exercise and lower caloric intake. Recheck labs and follow up pending reports. - TSH - COMPLETE METABOLIC PANEL WITH GFR - Lipid panel       Vernie Murders, PA  [facility]

## 2017-03-20 LAB — COMPLETE METABOLIC PANEL WITH GFR
AG RATIO: 1.8 (calc) (ref 1.0–2.5)
ALBUMIN MSPROF: 4.2 g/dL (ref 3.6–5.1)
ALT: 23 U/L (ref 9–46)
AST: 20 U/L (ref 10–35)
Alkaline phosphatase (APISO): 60 U/L (ref 40–115)
BUN: 12 mg/dL (ref 7–25)
CO2: 29 mmol/L (ref 20–32)
CREATININE: 0.99 mg/dL (ref 0.70–1.33)
Calcium: 9.3 mg/dL (ref 8.6–10.3)
Chloride: 109 mmol/L (ref 98–110)
GFR, EST AFRICAN AMERICAN: 102 mL/min/{1.73_m2} (ref >=60)
GFR, EST NON AFRICAN AMERICAN: 88 mL/min/{1.73_m2} (ref >=60)
Globulin: 2.3 g/dL (calc) (ref 1.9–3.7)
Glucose, Bld: 110 mg/dL — ABNORMAL HIGH (ref 65–99)
Potassium: 4.4 mmol/L (ref 3.5–5.3)
SODIUM: 143 mmol/L (ref 135–146)
TOTAL PROTEIN: 6.5 g/dL (ref 6.1–8.1)
Total Bilirubin: 0.8 mg/dL (ref 0.2–1.2)

## 2017-03-20 LAB — LIPID PANEL
CHOL/HDL RATIO: 3.8 (calc) (ref <5.0)
Cholesterol: 176 mg/dL (ref <200)
HDL: 46 mg/dL (ref >40)
LDL CHOLESTEROL (CALC): 113 mg/dL — AB
NON-HDL CHOLESTEROL (CALC): 130 mg/dL — AB (ref <130)
Triglycerides: 78 mg/dL (ref <150)

## 2017-03-20 LAB — TSH: TSH: 0.95 m[IU]/L (ref 0.40–4.50)

## 2017-03-22 ENCOUNTER — Ambulatory Visit: Payer: BC Managed Care – PPO | Admitting: Family Medicine

## 2017-05-17 ENCOUNTER — Other Ambulatory Visit: Payer: Self-pay | Admitting: Family Medicine

## 2017-10-11 ENCOUNTER — Ambulatory Visit: Payer: BC Managed Care – PPO | Admitting: Family Medicine

## 2017-10-11 ENCOUNTER — Encounter: Payer: Self-pay | Admitting: Family Medicine

## 2017-10-11 VITALS — BP 120/78 | HR 65 | Temp 97.6°F | Ht 73.0 in | Wt 230.6 lb

## 2017-10-11 DIAGNOSIS — E7800 Pure hypercholesterolemia, unspecified: Secondary | ICD-10-CM

## 2017-10-11 DIAGNOSIS — Z0000 Encounter for general adult medical examination without abnormal findings: Secondary | ICD-10-CM | POA: Diagnosis not present

## 2017-10-11 DIAGNOSIS — L639 Alopecia areata, unspecified: Secondary | ICD-10-CM

## 2017-10-11 NOTE — Progress Notes (Signed)
Patient: Carl Haney Male    DOB: 06-03-1965   52 y.o.   MRN: [id number] Visit Date: 10/11/2017  Today's Provider: Vernie Murders, PA   Chief Complaint  Patient presents with  . Hyperlipidemia  . Follow-up   Subjective:    HPI  Lipid/Cholesterol, Follow-up:   Last seen for this 6 months ago.  Management changes since that visit include advised to continue medication, exercise and follow a low fat diet.  Last Lipid Panel:    Component Value Date/Time   CHOL 176 03/19/2017 1102   CHOL 174 09/09/2016 0812   TRIG 78 03/19/2017 1102   HDL 46 03/19/2017 1102   HDL 37 (L) 09/09/2016 0812   CHOLHDL 3.8 03/19/2017 1102   LDLCALC 113 (H) 03/19/2017 1102   He reports fair compliance with treatment. Patient states he isn't doing to well with diet.  He is not having side effects.  Current symptoms include none Weight trend: stable Prior visit with dietician: no Current diet: on average, 3 meals per day Current exercise: walking  Wt Readings from Last 3 Encounters:  10/11/17 230 lb 9.6 oz (104.6 kg)  03/19/17 233 lb (105.7 kg)  10/20/16 225 lb (102.1 kg)   ------------------------------------------------------------------- History reviewed. No pertinent past medical history. Patient Active Problem List   Diagnosis Date Noted  . AA (alopecia areata) 07/01/2015  . Pure hypercholesterolemia 02/07/2008  . Family history of sudden cardiac death (SCD) November 15, 2007   Past Surgical History:  Procedure Laterality Date  . COLONOSCOPY WITH PROPOFOL N/A 10/20/2016   Procedure: COLONOSCOPY WITH PROPOFOL;  Surgeon: Jonathon Bellows, MD;  Location[facility] ENDOSCOPY;  Service: Endoscopy;  Laterality: N/A;  . VASECTOMY     Family History  Problem Relation Age of Onset  . Healthy Mother   . CAD Father   . Diabetes Father   . Hypertension Father   . Healthy Sister   . Dementia Maternal Grandmother   . Heart attack Maternal Grandfather   . Heart attack Paternal Grandfather   . Healthy  Sister    No Known Allergies  Current Outpatient Medications:  .  pravastatin (PRAVACHOL) 40 MG tablet, TAKE 1 TABLET(40 MG) BY MOUTH DAILY, Disp: 30 tablet, Rfl: 6   Review of Systems  Constitutional: Negative.   Respiratory: Negative.   Cardiovascular: Negative.   Musculoskeletal: Negative.    Social History   Tobacco Use  . Smoking status: Never Smoker  . Smokeless tobacco: Never Used  Substance Use Topics  . Alcohol use: Yes    Alcohol/week: 0.0 oz    Comment: Occasionally   Objective:   BP 120/78 (BP Location: Right Arm, Patient Position: Sitting, Cuff Size: Normal)   Pulse 65   Temp 97.6 F (36.4 C) (Oral)   Ht 6\' 1"  (1.854 m)   Wt 230 lb 9.6 oz (104.6 kg)   SpO2 98%   BMI 30.42 kg/m  Vitals:   10/11/17 1117  BP: 120/78  Pulse: 65  Temp: 97.6 F (36.4 C)  TempSrc: Oral  SpO2: 98%  Weight: 230 lb 9.6 oz (104.6 kg)  Height: 6\' 1"  (1.854 m)   Wt Readings from Last 3 Encounters:  10/11/17 230 lb 9.6 oz (104.6 kg)  03/19/17 233 lb (105.7 kg)  10/20/16 225 lb (102.1 kg)   Physical Exam  Constitutional: He is oriented to person, place, and time. He appears well-developed and well-nourished.  HENT:  Head: Normocephalic and atraumatic.  Right Ear: External ear normal.  Left Ear: External ear normal.  Nose: Nose normal.  Mouth/Throat: Oropharynx is clear and moist.  Eyes: Pupils are equal, round, and reactive to light. Conjunctivae and EOM are normal. Right eye exhibits no discharge.  Neck: Normal range of motion. Neck supple. No tracheal deviation present. No thyromegaly present.  Cardiovascular: Normal rate, regular rhythm, normal heart sounds and intact distal pulses.  No murmur heard. Pulmonary/Chest: Effort normal and breath sounds normal. No respiratory distress. He has no wheezes. He has no rales. He exhibits no tenderness.  Abdominal: Soft. He exhibits no distension and no mass. There is no tenderness. There is no rebound and no guarding.    Genitourinary: Rectum normal, prostate normal and penis normal. Rectal exam shows guaiac negative stool.  Musculoskeletal: Normal range of motion. He exhibits no edema or tenderness.  Slight patellar crepitus to test ROM.  Lymphadenopathy:    He has no cervical adenopathy.  Neurological: He is alert and oriented to person, place, and time. He has normal reflexes. He displays normal reflexes. No cranial nerve deficit. He exhibits normal muscle tone. Coordination normal.  Skin: Skin is warm and dry. No erythema.  5-6 one cm thinning spots on scalp with one 3 cm bald spot at the left vertex.  Psychiatric: He has a normal mood and affect. His behavior is normal. Judgment and thought content normal.   Depression screen [facility] 2/9 09/08/2016 07/02/2015  Decreased Interest 0 0  Down, Depressed, Hopeless 0 0  PHQ - 2 Score 0 0  Altered sleeping 0 -  Tired, decreased energy 0 -  Change in appetite 0 -  Feeling bad or failure about yourself  0 -  Trouble concentrating 0 -  Moving slowly or fidgety/restless 0 -  Suicidal thoughts 0 -  PHQ-9 Score 0 -  Difficult doing work/chores Not difficult at all -       Assessment & Plan:     1. Annual physical exam Healthy 52 yo male. Immunizations up to date. Had normal PSA and DRE last year. Stool negative hemoccult slide today. Colonoscopy on 10-20-16 showed normal exam except one 8 mm benign polyp. Gastroenterologist recommended repeat test in 5-10 years. Given anticipatory guidance and will get routine labs. - CBC with Differential/Platelet - Comprehensive metabolic panel - Lipid panel - TSH  2. Pure hypercholesterolemia Tolerating the Pravastatin 40 mg qd without side effects. Weight stable with 3 lbs weight loss. Continue low fat diet and regular exercise. Recheck CMP, Lipid Panel and TSH. Follow up pending reports. - Comprehensive metabolic panel - Lipid panel - TSH  3. AA (alopecia areata) Unchanged. Areas are not enlarging. Check CBC and  TSH. - CBC with Differential/Platelet - TSH       Vernie Murders, PA  [facility]

## 2017-10-12 ENCOUNTER — Telehealth: Payer: Self-pay

## 2017-10-12 LAB — LIPID PANEL
CHOLESTEROL TOTAL: 156 mg/dL (ref 100–199)
Chol/HDL Ratio: 3.9 ratio (ref 0.0–5.0)
HDL: 40 mg/dL (ref >39)
LDL Calculated: 91 mg/dL (ref 0–99)
Triglycerides: 124 mg/dL (ref 0–149)
VLDL CHOLESTEROL CAL: 25 mg/dL (ref 5–40)

## 2017-10-12 LAB — COMPREHENSIVE METABOLIC PANEL
A/G RATIO: 1.8 (ref 1.2–2.2)
ALK PHOS: 68 IU/L (ref 39–117)
ALT: 22 IU/L (ref 0–44)
AST: 18 IU/L (ref 0–40)
Albumin: 4.2 g/dL (ref 3.5–5.5)
BILIRUBIN TOTAL: 0.9 mg/dL (ref 0.0–1.2)
BUN/Creatinine Ratio: 10 (ref 9–20)
BUN: 11 mg/dL (ref 6–24)
CHLORIDE: 106 mmol/L (ref 96–106)
CO2: 24 mmol/L (ref 20–29)
Calcium: 9.2 mg/dL (ref 8.7–10.2)
Creatinine, Ser: 1.07 mg/dL (ref 0.76–1.27)
GFR calc Af Amer: 92 mL/min/{1.73_m2} (ref >59)
GFR calc non Af Amer: 80 mL/min/{1.73_m2} (ref >59)
GLUCOSE: 99 mg/dL (ref 65–99)
Globulin, Total: 2.4 g/dL (ref 1.5–4.5)
POTASSIUM: 3.8 mmol/L (ref 3.5–5.2)
Sodium: 143 mmol/L (ref 134–144)
Total Protein: 6.6 g/dL (ref 6.0–8.5)

## 2017-10-12 LAB — CBC WITH DIFFERENTIAL/PLATELET
BASOS ABS: 0 10*3/uL (ref 0.0–0.2)
Basos: 1 %
EOS (ABSOLUTE): 0.2 10*3/uL (ref 0.0–0.4)
Eos: 4 %
Hematocrit: 42 % (ref 37.5–51.0)
Hemoglobin: 14.9 g/dL (ref 13.0–17.7)
IMMATURE GRANS (ABS): 0 10*3/uL (ref 0.0–0.1)
Immature Granulocytes: 0 %
LYMPHS ABS: 1.1 10*3/uL (ref 0.7–3.1)
LYMPHS: 26 %
MCH: 30.7 pg (ref 26.6–33.0)
MCHC: 35.5 g/dL (ref 31.5–35.7)
MCV: 86 fL (ref 79–97)
MONOS ABS: 0.5 10*3/uL (ref 0.1–0.9)
Monocytes: 11 %
NEUTROS ABS: 2.5 10*3/uL (ref 1.4–7.0)
Neutrophils: 58 %
PLATELETS: 224 10*3/uL (ref 150–450)
RBC: 4.86 x10E6/uL (ref 4.14–5.80)
RDW: 14.5 % (ref 12.3–15.4)
WBC: 4.3 10*3/uL (ref 3.4–10.8)

## 2017-10-12 LAB — TSH: TSH: 1.3 u[IU]/mL (ref 0.450–4.500)

## 2017-10-12 NOTE — Telephone Encounter (Signed)
Patient advised. He states he has a 90 day supply of Pravastatin 40 mg. He preferred to take half of 40 mg tablets until finished then call back to request new RX for 20 mg.

## 2018-02-26 ENCOUNTER — Other Ambulatory Visit: Payer: Self-pay | Admitting: Family Medicine

## 2018-05-05 ENCOUNTER — Other Ambulatory Visit: Payer: Self-pay

## 2018-05-05 MED ORDER — PRAVASTATIN SODIUM 40 MG PO TABS: ORAL_TABLET | ORAL | 0 refills | Status: DC

## 2018-05-05 NOTE — Telephone Encounter (Signed)
Remind patient he is due for follow up labs in the next 2-3 weeks. Prescription sent to [facility] at [facility] for 90 days.

## 2018-05-05 NOTE — Telephone Encounter (Signed)
Pt states he takes Pravastatin 20mg  a day now. Please call in 90 supply.  Contact [phone] Advertising account planner).  Please advise.    Thanks,   -Mickel Baas

## 2018-08-31 ENCOUNTER — Other Ambulatory Visit: Payer: Self-pay | Admitting: *Deleted

## 2018-08-31 DIAGNOSIS — E7800 Pure hypercholesterolemia, unspecified: Secondary | ICD-10-CM

## 2018-08-31 NOTE — Telephone Encounter (Signed)
Patient called for new rx to be sent into [facility] for pravastatin 20 mg. There is a message in chart giving the ok to change dose but that was from 10/13/2018. Please advise?

## 2018-08-31 NOTE — Telephone Encounter (Signed)
That date should be 10/12/2017.  Patient got refill at that time but has not been seen since then

## 2018-09-01 MED ORDER — PRAVASTATIN SODIUM 20 MG PO TABS: 20.0000 mg | ORAL_TABLET | Freq: Every day | ORAL | 0 refills | Status: DC

## 2018-09-01 NOTE — Telephone Encounter (Signed)
May refill Pravastatin at 20 mg qd #90. Need to schedule recheck appointment and get labs rechecked in the next 1-2 months.

## 2018-09-01 NOTE — Telephone Encounter (Signed)
Patient advised. Appointment scheduled for 11/07/2018 at 8am. Prescription sent into pharmacy.

## 2018-11-07 ENCOUNTER — Encounter: Payer: Self-pay | Admitting: Family Medicine

## 2018-11-07 ENCOUNTER — Ambulatory Visit: Payer: BC Managed Care – PPO | Admitting: Family Medicine

## 2018-11-07 ENCOUNTER — Other Ambulatory Visit: Payer: Self-pay

## 2018-11-07 VITALS — BP 140/82 | HR 71 | Temp 98.1°F | Wt 231.0 lb

## 2018-11-07 DIAGNOSIS — E7800 Pure hypercholesterolemia, unspecified: Secondary | ICD-10-CM | POA: Diagnosis not present

## 2018-11-07 DIAGNOSIS — L639 Alopecia areata, unspecified: Secondary | ICD-10-CM

## 2018-11-07 NOTE — Progress Notes (Signed)
Carl Haney  MRN: [id number] DOB: 02-07-1966  Subjective:  HPI   The patient is a 53 year old male who presents for follow up of hypercholesterolemia.  On his last visit which was his annual exam on 6//17/19 he had his labs done.  After reported he was instructed to decrease his Pravastatin to 20 mg daily.  Due to an excess of the 40 mg pills he had not made that switch until about 1 month ago.  Lab Results  Component Value Date   CHOL 156 10/11/2017   HDL 40 10/11/2017   LDLCALC 91 10/11/2017   TRIG 124 10/11/2017   CHOLHDL 3.9 10/11/2017   Patient Active Problem List   Diagnosis Date Noted  . AA (alopecia areata) 07/01/2015  . Pure hypercholesterolemia 02/07/2008  . Family history of sudden cardiac death (SCD) 30-Nov-2007   No past medical history on file.  Past Surgical History:  Procedure Laterality Date  . COLONOSCOPY WITH PROPOFOL N/A 10/20/2016   Procedure: COLONOSCOPY WITH PROPOFOL;  Surgeon: Jonathon Bellows, MD;  Location: [facility] ENDOSCOPY;  Service: Endoscopy;  Laterality: N/A;  . VASECTOMY     Family History  Problem Relation Age of Onset  . Healthy Mother   . CAD Father   . Diabetes Father   . Hypertension Father   . Healthy Sister   . Dementia Maternal Grandmother   . Heart attack Maternal Grandfather   . Heart attack Paternal Grandfather   . Healthy Sister    Social History   Socioeconomic History  . Marital status: Married    Spouse name: Museum/gallery conservator  . Number of children: 2  . Years of education: college  . Highest education level: Not on file  Occupational History    Employer: [employer]  . Financial resource strain: Not on file  . Food insecurity    Worry: Not on file    Inability: Not on file  . Transportation needs    Medical: Not on file    Non-medical: Not on file  Tobacco Use  . Smoking status: Never Smoker  . Smokeless tobacco: Never Used  Substance and Sexual Activity  . Alcohol use: Yes    Alcohol/week: 0.0  standard drinks    Comment: Occasionally  . Drug use: No  . Sexual activity: Yes  Lifestyle  . Physical activity    Days per week: Not on file    Minutes per session: Not on file  . Stress: Not on file  Relationships  . Social Herbalist on phone: Not on file    Gets together: Not on file    Attends religious service: Not on file    Active member of club or organization: Not on file    Attends meetings of clubs or organizations: Not on file    Relationship status: Not on file  . Intimate partner violence    Fear of current or ex partner: Not on file    Emotionally abused: Not on file    Physically abused: Not on file    Forced sexual activity: Not on file  Other Topics Concern  . Not on file  Social History Narrative  . Not on file   Outpatient Encounter Medications as of 2018-11-30  Medication Sig  . pravastatin (PRAVACHOL) 20 MG tablet Take 1 tablet (20 mg total) by mouth daily.   No facility-administered encounter medications on file as of Nov 30, 2018.    No Known Allergies  Review of Systems  Constitutional: Negative for fever.  Respiratory: Negative for cough and wheezing.   Cardiovascular: Negative for chest pain, palpitations, orthopnea, claudication and leg swelling.    Objective:  BP 140/82 (BP Location: Right Arm, Patient Position: Sitting, Cuff Size: Normal)   Pulse 71   Temp 98.1 F (36.7 C) (Oral)   Wt 231 lb (104.8 kg)   SpO2 97%   BMI 30.48 kg/m   Wt Readings from Last 3 Encounters:  11/07/18 231 lb (104.8 kg)  10/11/17 230 lb 9.6 oz (104.6 kg)  03/19/17 233 lb (105.7 kg)    Physical Exam  Constitutional: He is oriented to person, place, and time and well-developed, well-nourished, and in no distress.  HENT:  Head: Normocephalic.  Eyes: Conjunctivae are normal.  Neck: Neck supple.  Cardiovascular: Normal rate.  Pulmonary/Chest: Effort normal and breath sounds normal.  Abdominal: Soft.  Musculoskeletal: Normal range of motion.   Neurological: He is alert and oriented to person, place, and time.  Skin: No rash noted.  One 2-3 cm area of alopecia areata unchanged left vertex of scalp.  Psychiatric: Mood, affect and judgment normal.    Assessment and Plan :  1. Pure hypercholesterolemia Presently on Pravachol 20 mg qd. No side effects. Has kept weight stable. Will check routine labs and follow up pending reports.  - CBC with Differential/Platelet - Comprehensive metabolic panel - Lipid panel - TSH  2. AA (alopecia areata) Only once spot on the scalp. Essentially unchanged. - CBC with Differential/Platelet - TSH

## 2018-11-08 ENCOUNTER — Other Ambulatory Visit: Payer: Self-pay

## 2018-11-08 DIAGNOSIS — E7800 Pure hypercholesterolemia, unspecified: Secondary | ICD-10-CM

## 2018-11-08 LAB — COMPREHENSIVE METABOLIC PANEL
ALT: 19 IU/L (ref 0–44)
AST: 23 IU/L (ref 0–40)
Albumin/Globulin Ratio: 1.9 (ref 1.2–2.2)
Albumin: 4.3 g/dL (ref 3.8–4.9)
Alkaline Phosphatase: 67 IU/L (ref 39–117)
BUN/Creatinine Ratio: 11 (ref 9–20)
BUN: 13 mg/dL (ref 6–24)
Bilirubin Total: 0.8 mg/dL (ref 0.0–1.2)
CO2: 21 mmol/L (ref 20–29)
Calcium: 9.5 mg/dL (ref 8.7–10.2)
Chloride: 106 mmol/L (ref 96–106)
Creatinine, Ser: 1.2 mg/dL (ref 0.76–1.27)
GFR calc Af Amer: 80 mL/min/{1.73_m2} (ref >59)
GFR calc non Af Amer: 69 mL/min/{1.73_m2} (ref >59)
Globulin, Total: 2.3 g/dL (ref 1.5–4.5)
Glucose: 101 mg/dL — ABNORMAL HIGH (ref 65–99)
Potassium: 4.3 mmol/L (ref 3.5–5.2)
Sodium: 143 mmol/L (ref 134–144)
Total Protein: 6.6 g/dL (ref 6.0–8.5)

## 2018-11-08 LAB — CBC WITH DIFFERENTIAL/PLATELET
Basophils Absolute: 0 10*3/uL (ref 0.0–0.2)
Basos: 1 %
EOS (ABSOLUTE): 0.3 10*3/uL (ref 0.0–0.4)
Eos: 6 %
Hematocrit: 44.4 % (ref 37.5–51.0)
Hemoglobin: 15.4 g/dL (ref 13.0–17.7)
Immature Grans (Abs): 0 10*3/uL (ref 0.0–0.1)
Immature Granulocytes: 0 %
Lymphocytes Absolute: 1.2 10*3/uL (ref 0.7–3.1)
Lymphs: 25 %
MCH: 30.4 pg (ref 26.6–33.0)
MCHC: 34.7 g/dL (ref 31.5–35.7)
MCV: 88 fL (ref 79–97)
Monocytes Absolute: 0.5 10*3/uL (ref 0.1–0.9)
Monocytes: 10 %
Neutrophils Absolute: 2.8 10*3/uL (ref 1.4–7.0)
Neutrophils: 58 %
Platelets: 232 10*3/uL (ref 150–450)
RBC: 5.06 x10E6/uL (ref 4.14–5.80)
RDW: 13.2 % (ref 11.6–15.4)
WBC: 4.8 10*3/uL (ref 3.4–10.8)

## 2018-11-08 LAB — LIPID PANEL
Chol/HDL Ratio: 4.8 ratio (ref 0.0–5.0)
Cholesterol, Total: 195 mg/dL (ref 100–199)
HDL: 41 mg/dL (ref >39)
LDL Calculated: 122 mg/dL — ABNORMAL HIGH (ref 0–99)
Triglycerides: 162 mg/dL — ABNORMAL HIGH (ref 0–149)
VLDL Cholesterol Cal: 32 mg/dL (ref 5–40)

## 2018-11-08 LAB — TSH: TSH: 2.48 u[IU]/mL (ref 0.450–4.500)

## 2018-11-08 MED ORDER — PRAVASTATIN SODIUM 40 MG PO TABS: 40.0000 mg | ORAL_TABLET | Freq: Every day | ORAL | 3 refills | Status: DC

## 2019-06-24 ENCOUNTER — Ambulatory Visit: Payer: BC Managed Care – PPO | Attending: Internal Medicine

## 2019-06-24 DIAGNOSIS — Z23 Encounter for immunization: Secondary | ICD-10-CM | POA: Insufficient documentation

## 2019-06-24 NOTE — Progress Notes (Signed)
   Covid-19 Vaccination Clinic  Name:  SUSHANTH DIGANGI    MRN: [id number] DOB: April 05, 1966  06/24/2019  Mr. Peragine was observed post Covid-19 immunization for 15 minutes without incidence. He was provided with Vaccine Information Sheet and instruction to access the V-Safe system.   Mr. Pracht was instructed to call 911 with any severe reactions post vaccine: Marland Kitchen Difficulty breathing  . Swelling of your face and throat  . A fast heartbeat  . A bad rash all over your body  . Dizziness and weakness    Immunizations Administered    Name Date Dose VIS Date Route   Moderna COVID-19 Vaccine 06/24/2019  1:19 PM 0.5 mL 03/28/2019 Intramuscular   Manufacturer: Moderna   Lot: [id number]   [id number]

## 2019-07-22 ENCOUNTER — Ambulatory Visit: Payer: BC Managed Care – PPO | Attending: Internal Medicine

## 2019-07-22 DIAGNOSIS — Z23 Encounter for immunization: Secondary | ICD-10-CM

## 2019-07-22 NOTE — Progress Notes (Signed)
   Covid-19 Vaccination Clinic  Name:  ISAMI TORO    MRN: [id number] DOB: [birth year]  07/22/2019  Mr. Vaquera was observed post Covid-19 immunization for 15 minutes without incident. He was provided with Vaccine Information Sheet and instruction to access the V-Safe system.   Mr. Eichenauer was instructed to call 911 with any severe reactions post vaccine: Marland Kitchen Difficulty breathing  . Swelling of face and throat  . A fast heartbeat  . A bad rash all over body  . Dizziness and weakness   Immunizations Administered    Name Date Dose VIS Date Route   Moderna COVID-19 Vaccine 07/22/2019 12:30 PM 0.5 mL 03/28/2019 Intramuscular   Manufacturer: Levan Hurst   Lot: [id number]   [place]: [id number]

## 2019-10-19 ENCOUNTER — Ambulatory Visit (INDEPENDENT_AMBULATORY_CARE_PROVIDER_SITE_OTHER): Payer: BC Managed Care – PPO | Admitting: Family Medicine

## 2019-10-19 ENCOUNTER — Other Ambulatory Visit: Payer: Self-pay

## 2019-10-19 ENCOUNTER — Encounter: Payer: Self-pay | Admitting: Family Medicine

## 2019-10-19 ENCOUNTER — Ambulatory Visit
Admission: RE | Admit: 2019-10-19 | Discharge: 2019-10-19 | Disposition: A | Discharge status: Home / Self Care | Payer: BC Managed Care – PPO | Attending: Family Medicine | Admitting: Family Medicine

## 2019-10-19 ENCOUNTER — Ambulatory Visit
Admission: RE | Admit: 2019-10-19 | Discharge: 2019-10-19 | Disposition: A | Discharge status: Home / Self Care | Payer: BC Managed Care – PPO | Source: Ambulatory Visit | Attending: Family Medicine | Admitting: Family Medicine

## 2019-10-19 VITALS — BP 138/82 | HR 80 | Temp 98.2°F | Ht 72.5 in | Wt 226.4 lb

## 2019-10-19 DIAGNOSIS — M79671 Pain in right foot: Secondary | ICD-10-CM

## 2019-10-19 DIAGNOSIS — M25552 Pain in left hip: Secondary | ICD-10-CM | POA: Diagnosis not present

## 2019-10-19 DIAGNOSIS — Z125 Encounter for screening for malignant neoplasm of prostate: Secondary | ICD-10-CM

## 2019-10-19 DIAGNOSIS — Z0000 Encounter for general adult medical examination without abnormal findings: Secondary | ICD-10-CM | POA: Diagnosis not present

## 2019-10-19 DIAGNOSIS — E7800 Pure hypercholesterolemia, unspecified: Secondary | ICD-10-CM | POA: Diagnosis not present

## 2019-10-19 DIAGNOSIS — M79673 Pain in unspecified foot: Secondary | ICD-10-CM | POA: Diagnosis present

## 2019-10-19 DIAGNOSIS — B351 Tinea unguium: Secondary | ICD-10-CM

## 2019-10-19 DIAGNOSIS — L639 Alopecia areata, unspecified: Secondary | ICD-10-CM

## 2019-10-19 NOTE — Progress Notes (Signed)
Complete physical exam   Patient: Carl Haney   DOB: 1965/07/05   54 y.o. Male  MRN: [id number] Visit Date: 10/19/2019  Today's healthcare provider: Vernie Murders, PA   Chief Complaint  Patient presents with   Annual Exam   Subjective    Carl Haney is a 54 y.o. male who presents today for a complete physical exam.  He reports consuming a general diet. The patient does not participate in regular exercise at present. He generally feels fairly well. He reports sleeping fairly well. He does have additional problems to discuss today. Patient reports that for the past month he has had pain in the left side of his hip, patient reports limited range of motion and states that pain  Is more noticeable with twisting and bending. Patient reports that 4 days ago while bending his right toes he felt a immediate sharp pain when curling his second toe and reports pain and inability to move toe.     No past medical history on file.    Past Surgical History:  Procedure Laterality Date   COLONOSCOPY WITH PROPOFOL N/A 10/20/2016   Procedure: COLONOSCOPY WITH PROPOFOL;  Surgeon: Jonathon Bellows, MD;  Location: [facility] ENDOSCOPY;  Service: Endoscopy;  Laterality: N/A;   VASECTOMY     Social History   Socioeconomic History   Marital status: Married    Spouse name: Therapist, nutritional of children: 2   Years of education: college   Highest education level: Not on file  Occupational History    Employer:  [employer]  Tobacco Use   Smoking status: Never Smoker   Smokeless tobacco: Never Used  Scientific laboratory technician Use: Never used  Substance and Sexual Activity   Alcohol use: Yes    Alcohol/week: 0.0 standard drinks    Comment: Occasionally   Drug use: No   Sexual activity: Yes  Other Topics Concern   Not on file  Social History Narrative   Not on file   Social Determinants of Health   Financial Resource Strain:    Difficulty of Paying Living Expenses:   Food Insecurity:    Worried  About Charity fundraiser in the Last Year:    Arboriculturist in the Last Year:   Transportation Needs:    Film/video editor (Medical):    Lack of Transportation (Non-Medical):   Physical Activity:    Days of Exercise per Week:    Minutes of Exercise per Session:   Stress:    Feeling of Stress :   Social Connections:    Frequency of Communication with Friends and Family:    Frequency of Social Gatherings with Friends and Family:    Attends Religious Services:    Active Member of Clubs or Organizations:    Attends Music therapist:    Marital Status:   Intimate Partner Violence:    Fear of Current or Ex-Partner:    Emotionally Abused:    Physically Abused:    Sexually Abused:    Family Status  Relation Name Status   Mother  Alive   Father  Alive   Sister 1 Alive   MGM  Deceased   MGF  Deceased   PGM  Deceased   PGF  Deceased   Sister 2 Alive   Family History  Problem Relation Age of Onset   Healthy Mother    CAD Father    Diabetes Father    Hypertension Father  Healthy Sister    Dementia Maternal Grandmother    Heart attack Maternal Grandfather    Heart attack Paternal Grandfather    Healthy Sister    No Known Allergies   Patient Care Team: Lurena Naeve, Vickki Muff, PA as PCP - General (Family Medicine)   Medications: Outpatient Medications Prior to Visit  Medication Sig   pravastatin (PRAVACHOL) 40 MG tablet Take 1 tablet (40 mg total) by mouth daily.   No facility-administered medications prior to visit.   Review of Systems  All other systems reviewed and are negative.   Objective    BP 138/82   Pulse 80   Temp 98.2 F (36.8 C) (Oral)   Ht 6' 0.5" (1.842 m)   Wt 226 lb 6.4 oz (102.7 kg)   SpO2 97%   BMI 30.28 kg/m  BP Readings from Last 3 Encounters:  10/19/19 138/82  11/07/18 140/82  10/11/17 120/78   Wt Readings from Last 3 Encounters:  10/19/19 226 lb 6.4 oz (102.7 kg)  11/07/18 231 lb (104.8 kg)  10/11/17 230 lb 9.6  oz (104.6 kg)   Physical Exam Constitutional:      Appearance: He is well-developed.  HENT:     Head: Normocephalic and atraumatic.     Right Ear: External ear normal.     Left Ear: External ear normal.     Nose: Nose normal.  Eyes:     General:        Right eye: No discharge.     Conjunctiva/sclera: Conjunctivae normal.     Pupils: Pupils are equal, round, and reactive to light.  Neck:     Thyroid: No thyromegaly.     Trachea: No tracheal deviation.  Cardiovascular:     Rate and Rhythm: Normal rate and regular rhythm.     Heart sounds: Normal heart sounds. No murmur heard. Pulmonary:     Effort: Pulmonary effort is normal. No respiratory distress.     Breath sounds: Normal breath sounds. No wheezing or rales.  Chest:     Chest wall: No tenderness.  Abdominal:     General: There is no distension.     Palpations: Abdomen is soft. There is no mass.     Tenderness: There is no abdominal tenderness. There is no guarding or rebound.  Musculoskeletal:        General: No tenderness. Normal range of motion.     Cervical back: Normal range of motion and neck supple.     Comments: Pain in left hip to test ROM. No tenderness over the trochanteric bursa. Right forefoot and toe pain with onychomycosis of nails. Good pulses and no edema or erythema.  Lymphadenopathy:     Cervical: No cervical adenopathy.  Skin:    General: Skin is warm and dry.     Findings: No erythema or rash.     Comments: Areas of hair loss/balding in spots on scalp.  Neurological:     Mental Status: He is alert and oriented to person, place, and time.     Cranial Nerves: No cranial nerve deficit.     Motor: No abnormal muscle tone.     Coordination: Coordination normal.     Deep Tendon Reflexes: Reflexes are normal and symmetric. Reflexes normal.  Psychiatric:        Behavior: Behavior normal.        Thought Content: Thought content normal.        Judgment: Judgment normal.      Last depression screening  scores PHQ 2/9 Scores 10/19/2019 09/08/2016 07/02/2015  PHQ - 2 Score 0 0 0  PHQ- 9 Score 0 0 -   Last fall risk screening Fall Risk  10/19/2019  Falls in the past year? 0  Number falls in past yr: 0  Injury with Fall? 0   Last Audit-C alcohol use screening Alcohol Use Disorder Test (AUDIT) 10/19/2019  1. How often do you have a drink containing alcohol? 3  2. How many drinks containing alcohol do you have on a typical day when you are drinking? 1  3. How often do you have six or more drinks on one occasion? 1  AUDIT-C Score 5   A score of 3 or more in women, and 4 or more in men indicates increased risk for alcohol abuse, EXCEPT if all of the points are from question 1   No results found for any visits on 10/19/19.  Assessment & Plan    Routine Health Maintenance and Physical Exam  Exercise Activities and Dietary recommendations Goals   Encouraged to exercise 30-40 minutes 3-4 days a week.     Immunization History  Administered Date(s) Administered   Moderna SARS-COVID-2 Vaccination 06/24/2019, 07/22/2019   Td 02/04/2007   Tdap 07/02/2010    Health Maintenance  Topic Date Due   Hepatitis C Screening  Never done   INFLUENZA VACCINE  11/26/2019   TETANUS/TDAP  07/01/2020   COLONOSCOPY  10/20/2021   COVID-19 Vaccine  Completed   HIV Screening  Completed    Discussed health benefits of physical activity, and encouraged him to engage in regular exercise appropriate for his age and condition.  1. Annual physical exam Good general health. Counseled regarding health maintenance. Immunizations up to date. Check routine labs. - CBC with Differential/Platelet - Comprehensive metabolic panel - Lipid panel - PSA - TSH  2. Left hip pain Onset 1-2 months ago without obvious injury. May use NSAID as tolerated and will get x-ray evaluation to rule out arthritis or tumor. - DG HIP UNILAT W OR W/O PELVIS 2-3 VIEWS LEFT  3. Pain in right foot Pain in the right foot with nodule  at the base of the second toe. No known injury. Will get x-ray evaluation. Use NSAID prn. - DG Foot Complete Right  4. Pure hypercholesterolemia Tolerating statin daily without side effects. Recheck labs and continue low fat diet. Exercise regularly and drink plenty of fluids. Follow up pending reports. Will need to stop statin while treating onychomycosis with Lamisil. - Comprehensive metabolic panel - Lipid panel - TSH  5. AA (alopecia areata) Some increase in balding areas on scalp. May need dermatology referral pending lab reports. - CBC with Differential/Platelet - Comprehensive metabolic panel - TSH  6. Screening PSA (prostate specific antigen) Nocturia 1-2 times a night. No significant decrease in stream. No family history of prostate cancer. - PSA  7. Onychomycosis Podiatrist wants to treat with Lamisil and will need to stop his statin while taking it for 12 weeks.   No follow-ups on file.     I, Qiara Minetti, PA-C, have reviewed all documentation for this visit. The documentation on 01/09/21 for the exam, diagnosis, procedures, and orders are all accurate and complete.    Vernie Murders, [place] [phone] (phone) [phone] (fax)  [place]

## 2019-10-20 ENCOUNTER — Telehealth: Payer: Self-pay | Admitting: *Deleted

## 2019-10-20 DIAGNOSIS — E7800 Pure hypercholesterolemia, unspecified: Secondary | ICD-10-CM

## 2019-10-20 LAB — COMPREHENSIVE METABOLIC PANEL
ALT: 21 IU/L (ref 0–44)
AST: 23 IU/L (ref 0–40)
Albumin/Globulin Ratio: 1.6 (ref 1.2–2.2)
Albumin: 4.7 g/dL (ref 3.8–4.9)
Alkaline Phosphatase: 79 IU/L (ref 48–121)
BUN/Creatinine Ratio: 9 (ref 9–20)
BUN: 11 mg/dL (ref 6–24)
Bilirubin Total: 1.1 mg/dL (ref 0.0–1.2)
CO2: 23 mmol/L (ref 20–29)
Calcium: 9.9 mg/dL (ref 8.7–10.2)
Chloride: 104 mmol/L (ref 96–106)
Creatinine, Ser: 1.16 mg/dL (ref 0.76–1.27)
GFR calc Af Amer: 83 mL/min/{1.73_m2} (ref >59)
GFR calc non Af Amer: 71 mL/min/{1.73_m2} (ref >59)
Globulin, Total: 2.9 g/dL (ref 1.5–4.5)
Glucose: 90 mg/dL (ref 65–99)
Potassium: 4.1 mmol/L (ref 3.5–5.2)
Sodium: 141 mmol/L (ref 134–144)
Total Protein: 7.6 g/dL (ref 6.0–8.5)

## 2019-10-20 LAB — CBC WITH DIFFERENTIAL/PLATELET
Basophils Absolute: 0 10*3/uL (ref 0.0–0.2)
Basos: 1 %
EOS (ABSOLUTE): 0.2 10*3/uL (ref 0.0–0.4)
Eos: 4 %
Hematocrit: 47 % (ref 37.5–51.0)
Hemoglobin: 16.5 g/dL (ref 13.0–17.7)
Immature Grans (Abs): 0 10*3/uL (ref 0.0–0.1)
Immature Granulocytes: 0 %
Lymphocytes Absolute: 1.1 10*3/uL (ref 0.7–3.1)
Lymphs: 22 %
MCH: 31.5 pg (ref 26.6–33.0)
MCHC: 35.1 g/dL (ref 31.5–35.7)
MCV: 90 fL (ref 79–97)
Monocytes Absolute: 0.6 10*3/uL (ref 0.1–0.9)
Monocytes: 12 %
Neutrophils Absolute: 3.1 10*3/uL (ref 1.4–7.0)
Neutrophils: 61 %
Platelets: 263 10*3/uL (ref 150–450)
RBC: 5.23 x10E6/uL (ref 4.14–5.80)
RDW: 13.4 % (ref 11.6–15.4)
WBC: 5 10*3/uL (ref 3.4–10.8)

## 2019-10-20 LAB — TSH: TSH: 1.11 u[IU]/mL (ref 0.450–4.500)

## 2019-10-20 LAB — LIPID PANEL
Chol/HDL Ratio: 5.2 ratio — ABNORMAL HIGH (ref 0.0–5.0)
Cholesterol, Total: 214 mg/dL — ABNORMAL HIGH (ref 100–199)
HDL: 41 mg/dL (ref >39)
LDL Chol Calc (NIH): 153 mg/dL — ABNORMAL HIGH (ref 0–99)
Triglycerides: 109 mg/dL (ref 0–149)
VLDL Cholesterol Cal: 20 mg/dL (ref 5–40)

## 2019-10-20 LAB — PSA: Prostate Specific Ag, Serum: 0.8 ng/mL (ref 0.0–4.0)

## 2019-10-20 NOTE — Telephone Encounter (Signed)
-----   Message from Margo Common, [place] sent at 10/20/2019 10:45 AM EDT ----- All blood tests are normal except total cholesterol and LDL cholesterol are higher with need to switch the Pravastatin to Atorvastatin 40 mg qd #90 & 3 RF. Recheck progress in 4 months.

## 2019-10-20 NOTE — Telephone Encounter (Signed)
LMOVM for pt to return call 

## 2019-10-23 ENCOUNTER — Telehealth: Payer: Self-pay

## 2019-10-23 NOTE — Telephone Encounter (Signed)
DG Foot Complete Right Order: [id number] Status:  Final result Visible to patient:  Yes (seen) Dx:  Pain in right foot

## 2019-10-23 NOTE — Telephone Encounter (Signed)
-----   Message from Margo Common, [place] sent at 10/23/2019  5:06 PM EDT ----- Some degenerative changes in the right foot but no fractures. The hip x-rays showed some inflammation in the pelvic bones that can cause hip pain. May use Ibuprofen 200 mg 3-4 tablets TID with meals. If this plus moist heat applications does not relieve discomforts, may need a prednisone taper. Recheck progress in 3 weeks.

## 2019-10-25 MED ORDER — ATORVASTATIN CALCIUM 40 MG PO TABS: 40.0000 mg | ORAL_TABLET | Freq: Every day | ORAL | 3 refills | Status: AC

## 2019-10-25 NOTE — Telephone Encounter (Signed)
Patient advised as below. Patient verbalizes understanding and is in agreement with treatment plan.  

## 2019-11-30 ENCOUNTER — Other Ambulatory Visit: Payer: Self-pay | Admitting: Family Medicine

## 2019-11-30 DIAGNOSIS — E7800 Pure hypercholesterolemia, unspecified: Secondary | ICD-10-CM

## 2020-02-19 ENCOUNTER — Ambulatory Visit: Payer: Self-pay | Admitting: Family Medicine

## 2020-02-23 ENCOUNTER — Ambulatory Visit: Payer: BC Managed Care – PPO | Admitting: Family Medicine

## 2020-02-23 ENCOUNTER — Encounter: Payer: Self-pay | Admitting: Family Medicine

## 2020-02-23 ENCOUNTER — Other Ambulatory Visit: Payer: Self-pay

## 2020-02-23 VITALS — Temp 100.1°F

## 2020-02-23 DIAGNOSIS — R5083 Postvaccination fever: Secondary | ICD-10-CM | POA: Diagnosis not present

## 2020-02-23 NOTE — Progress Notes (Signed)
Virtual telephone visit    Virtual Visit via Telephone Note   This visit type was conducted due to national recommendations for restrictions regarding the COVID-19 Pandemic (e.g. social distancing) in an effort to limit this patient's exposure and mitigate transmission in our community. Due to his co-morbid illnesses, this patient is at least at moderate risk for complications without adequate follow up. This format is felt to be most appropriate for this patient at this time. The patient did not have access to video technology or had technical difficulties with video requiring transitioning to audio format only (telephone). Physical exam was limited to content and character of the telephone converstion.    Patient location: car Provider location: office  I discussed the limitations of evaluation and management by telemedicine and the availability of in person appointments. The patient expressed understanding and agreed to proceed.   Visit Date: 02/23/2020  Today's healthcare provider: Vernie Murders, PA   No chief complaint on file.  Subjective    HPI  Patient presented in the office for follow up of his lipids.  His temperature was checked at the initial part of the visit.  He was found to have a temperature of 100.1.  He states he had his Covid booster shot yesterday.  Since having a fever patient was instructed to go to his car and we would do the visit virtually.   He was last seen 4 months ago.  At that time he was switched from Pravastatin to Atorvastatin. Lab Results  Component Value Date   CHOL 214 (H) 10/19/2019   HDL 41 10/19/2019   LDLCALC 153 (H) 10/19/2019   TRIG 109 10/19/2019   CHOLHDL 5.2 (H) 10/19/2019       History reviewed. No pertinent past medical history. Past Surgical History:  Procedure Laterality Date  . COLONOSCOPY WITH PROPOFOL N/A 10/20/2016   Procedure: COLONOSCOPY WITH PROPOFOL;  Surgeon: Jonathon Bellows, MD;  Location: [facility] ENDOSCOPY;  Service:  Endoscopy;  Laterality: N/A;  . VASECTOMY     Social History   Tobacco Use  . Smoking status: Never Smoker  . Smokeless tobacco: Never Used  Vaping Use  . Vaping Use: Never used  Substance Use Topics  . Alcohol use: Yes    Alcohol/week: 0.0 standard drinks    Comment: Occasionally  . Drug use: No   Family Status  Relation Name Status  . Mother  Alive  . Father  Alive  . Sister 1 Alive  . MGM  Deceased  . MGF  Deceased  . PGM  Deceased  . PGF  Deceased  . Sister 2 Alive   No Known Allergies    Medications: Outpatient Medications Prior to Visit  Medication Sig  . atorvastatin (LIPITOR) 40 MG tablet Take 1 tablet (40 mg total) by mouth daily.   No facility-administered medications prior to visit.    Review of Systems  Constitutional: Positive for fever. Negative for chills and fatigue.  HENT: Negative for postnasal drip, rhinorrhea, sinus pressure and sore throat.   Respiratory: Negative for choking and shortness of breath.   Cardiovascular: Negative for chest pain.  Gastrointestinal: Negative for abdominal pain and diarrhea.  Musculoskeletal: Positive for myalgias.  Neurological: Negative for dizziness and headaches.      Objective    Temp 100.1 F (37.8 C) (Oral)  {Show previous vital signs (optional):23777::" " No apparent respiratory distress during telephonic interview. Voice a little raspy.  Assessment & Plan     1. Fever after vaccination  Had a COVID vaccination yesterday and feeling a little achy with temperature over 100 this morning. Recommend COVID restrictions, increase in fluid intake and may use Tylenol or Advil prn. If fever persists or other respiratory symptoms develop, should go to an Urgent Care for COVID test over the weekend.   No follow-ups on file.    I discussed the assessment and treatment plan with the patient. The patient was provided an opportunity to ask questions and all were answered. The patient agreed with the plan and  demonstrated an understanding of the instructions.   The patient was advised to call back or seek an in-person evaluation if the symptoms worsen or if the condition fails to improve as anticipated.  I provided 10 minutes of non-face-to-face time during this encounter.  Andres Shad, PA, have reviewed all documentation for this visit. The documentation on 02/23/20 for the exam, diagnosis, procedures, and orders are all accurate and complete.   Vernie Murders, [place] [phone] (phone) [phone] (fax)  [place]

## 2020-02-28 ENCOUNTER — Telehealth: Payer: Self-pay | Admitting: Family Medicine

## 2020-02-28 NOTE — Telephone Encounter (Signed)
Patient called to request orders for his blood work.   Please advise and call patient to confirm at [phone]

## 2020-02-29 ENCOUNTER — Other Ambulatory Visit: Payer: Self-pay | Admitting: Family Medicine

## 2020-02-29 DIAGNOSIS — E7800 Pure hypercholesterolemia, unspecified: Secondary | ICD-10-CM

## 2020-02-29 NOTE — Telephone Encounter (Signed)
Future order for recheck of elevated LDL cholesterol placed in chart to be released when he comes for fasting blood draw.

## 2020-03-01 NOTE — Telephone Encounter (Signed)
Tried calling patient. Left detailed message that lab orders have been place and he needs to be fasting for a minimum of 8 hours before having labs done. I also gave lab hours. OK per DPR to leave detailed message.

## 2020-03-07 ENCOUNTER — Other Ambulatory Visit: Payer: Self-pay

## 2020-03-07 DIAGNOSIS — E7800 Pure hypercholesterolemia, unspecified: Secondary | ICD-10-CM

## 2020-03-08 LAB — LIPID PANEL
Chol/HDL Ratio: 3.5 ratio (ref 0.0–5.0)
Cholesterol, Total: 148 mg/dL (ref 100–199)
HDL: 42 mg/dL (ref >39)
LDL Chol Calc (NIH): 90 mg/dL (ref 0–99)
Triglycerides: 81 mg/dL (ref 0–149)
VLDL Cholesterol Cal: 16 mg/dL (ref 5–40)

## 2020-03-08 LAB — COMPREHENSIVE METABOLIC PANEL
ALT: 24 IU/L (ref 0–44)
AST: 24 IU/L (ref 0–40)
Albumin/Globulin Ratio: 1.8 (ref 1.2–2.2)
Albumin: 4.4 g/dL (ref 3.8–4.9)
Alkaline Phosphatase: 67 IU/L (ref 44–121)
BUN/Creatinine Ratio: 12 (ref 9–20)
BUN: 12 mg/dL (ref 6–24)
Bilirubin Total: 1.2 mg/dL (ref 0.0–1.2)
CO2: 25 mmol/L (ref 20–29)
Calcium: 9.6 mg/dL (ref 8.7–10.2)
Chloride: 106 mmol/L (ref 96–106)
Creatinine, Ser: 1.02 mg/dL (ref 0.76–1.27)
GFR calc Af Amer: 97 mL/min/{1.73_m2} (ref >59)
GFR calc non Af Amer: 84 mL/min/{1.73_m2} (ref >59)
Globulin, Total: 2.5 g/dL (ref 1.5–4.5)
Glucose: 95 mg/dL (ref 65–99)
Potassium: 4.6 mmol/L (ref 3.5–5.2)
Sodium: 143 mmol/L (ref 134–144)
Total Protein: 6.9 g/dL (ref 6.0–8.5)

## 2020-03-14 NOTE — Telephone Encounter (Signed)
Last lipid panel in June 2021 showed a big elevation of total cholesterol and LDL cholesterol. That was when we changed from Pravastatin to the Lipitor. With this big change, wanted to recheck the lipids last month or now. Also, received a request to place orders for the labs 2 weeks ago. Hopefully, these levels are back down with the switch to Lipitor.

## 2020-03-14 NOTE — Telephone Encounter (Signed)
Pt calling back because he got another call to do his lab work.  But pt states he has already done labs, and needs to know if Simona Huh wants him to continue the Lipitor? Pt states it looks to him like his cholesterol is ok and within limits.. Pt has one pill left, and pt wants to know if he should refill his med.  Pt hope he does not have to take this med.

## 2020-03-14 NOTE — Telephone Encounter (Signed)
Please review. Thanks!  

## 2020-03-15 NOTE — Telephone Encounter (Signed)
Left detailed message on pt's vm letting him know labs were in good shape and to cb to schedule 6 month f/u.

## 2021-08-29 IMAGING — CR DG FOOT COMPLETE 3+V*R*
1 series · 3 of 3 positions shown · non-contrast
Comparison: None.

CLINICAL DATA: Pain in the RIGHT forefoot, second toe nodule at the
base of the toes.

EXAM:
RIGHT FOOT COMPLETE - 3+ VIEW

[Series 1: dg foot complete right · 0.14mm/px · 3 of 3 slices shown]
[im 1/3]
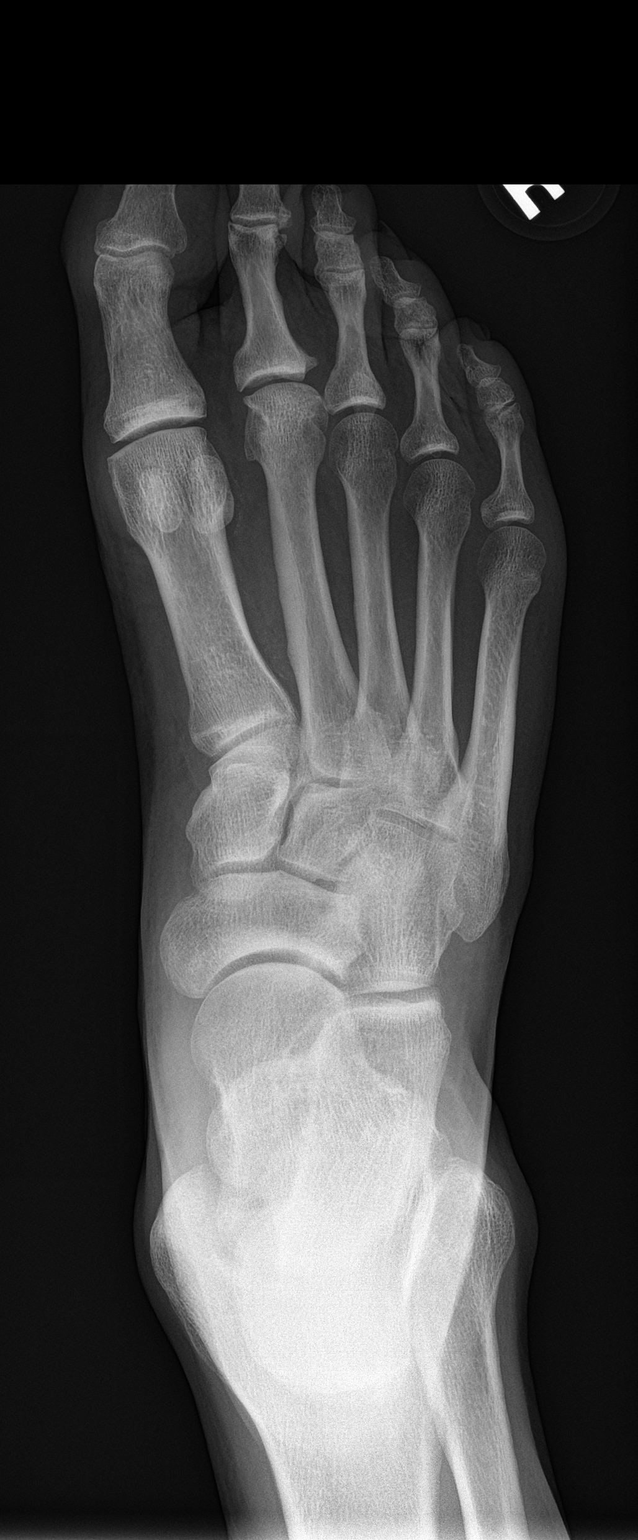
[im 2/3]
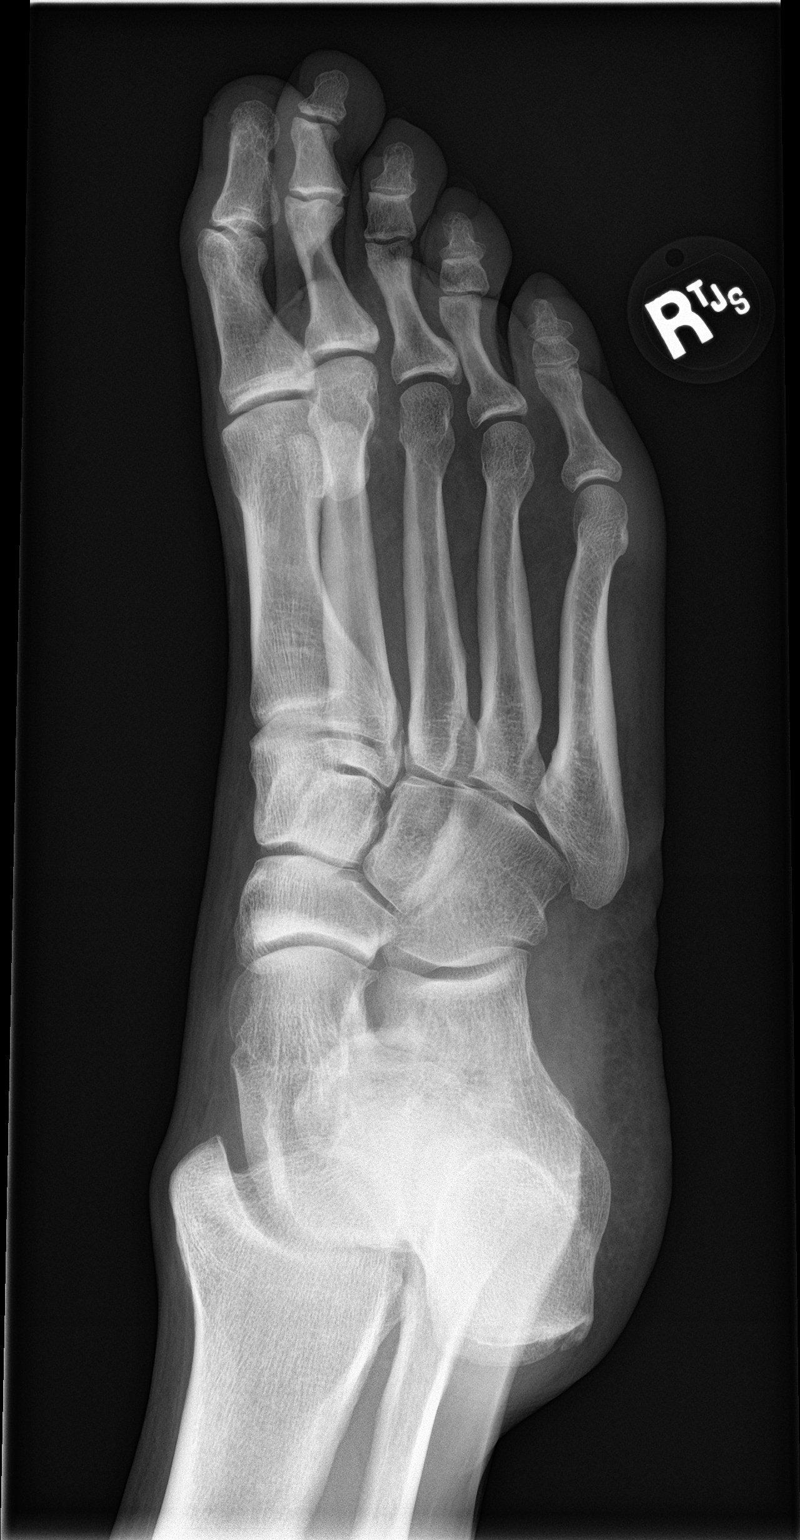
[im 3/3]
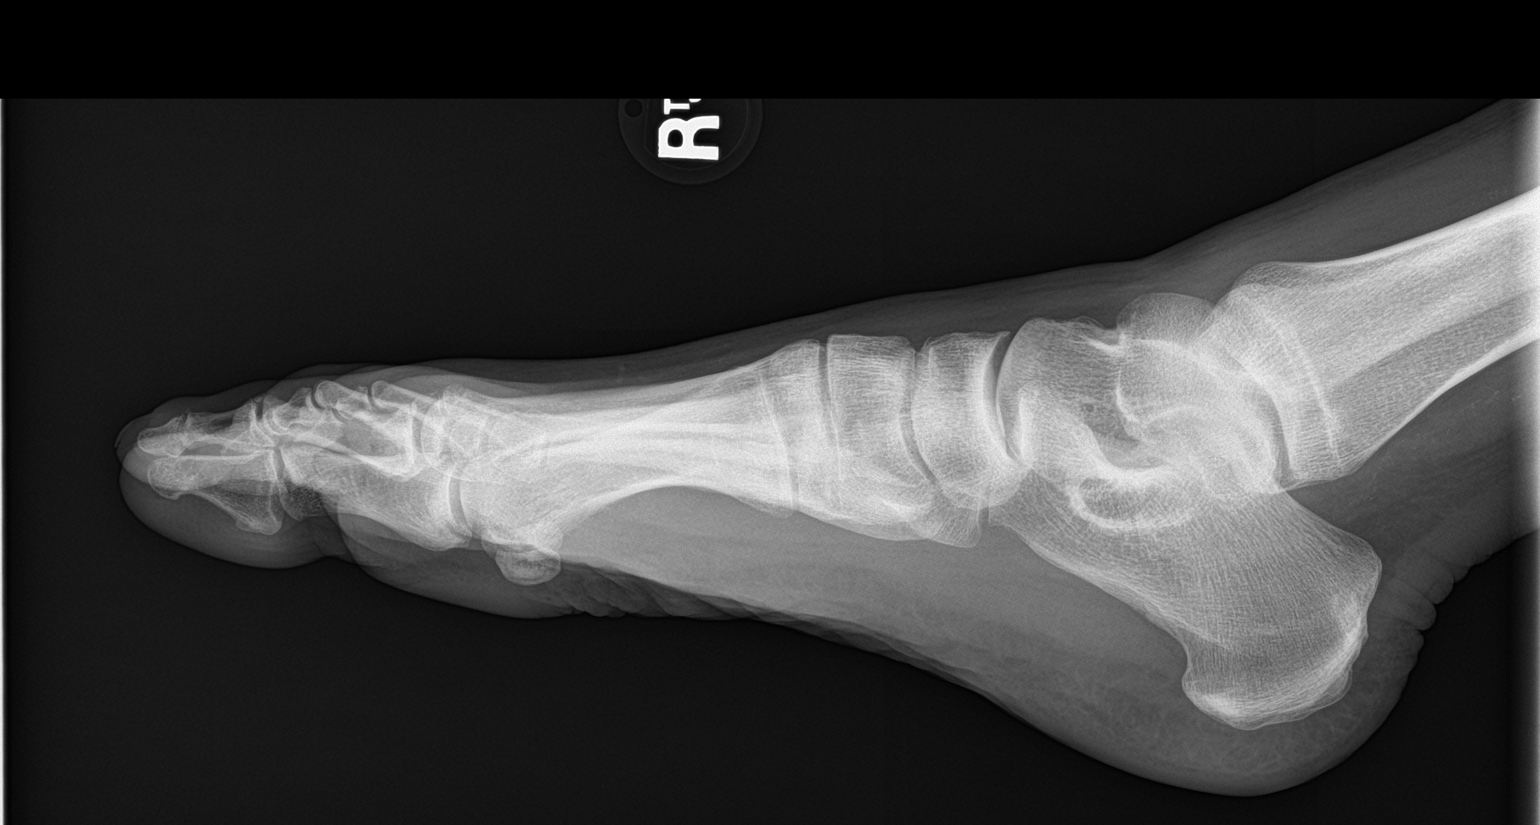

[3 of 3 positions shown; findings below may reference images not displayed]

FINDINGS: There is no acute fracture or subluxation. Degenerative changes are
identified at the proximal interphalangeal joint of the second
digit. No radiopaque foreign body or soft tissue gas.
IMPRESSION: Negative.

## 2021-08-29 IMAGING — CR DG HIP (WITH OR WITHOUT PELVIS) 2-3V*L*
1 series · 4 of 4 positions shown · non-contrast
Comparison: None.

CLINICAL DATA: No known injury. Posterior LEFT hip pain for 2
months.

EXAM:
DG HIP (WITH OR WITHOUT PELVIS) 2-3V LEFT

[Series 1: dg hip unilat w or w/o pelvis 2-3 views  · non-contrast · 0.14mm/px · 4 of 4 slices shown]
[im 1/4]
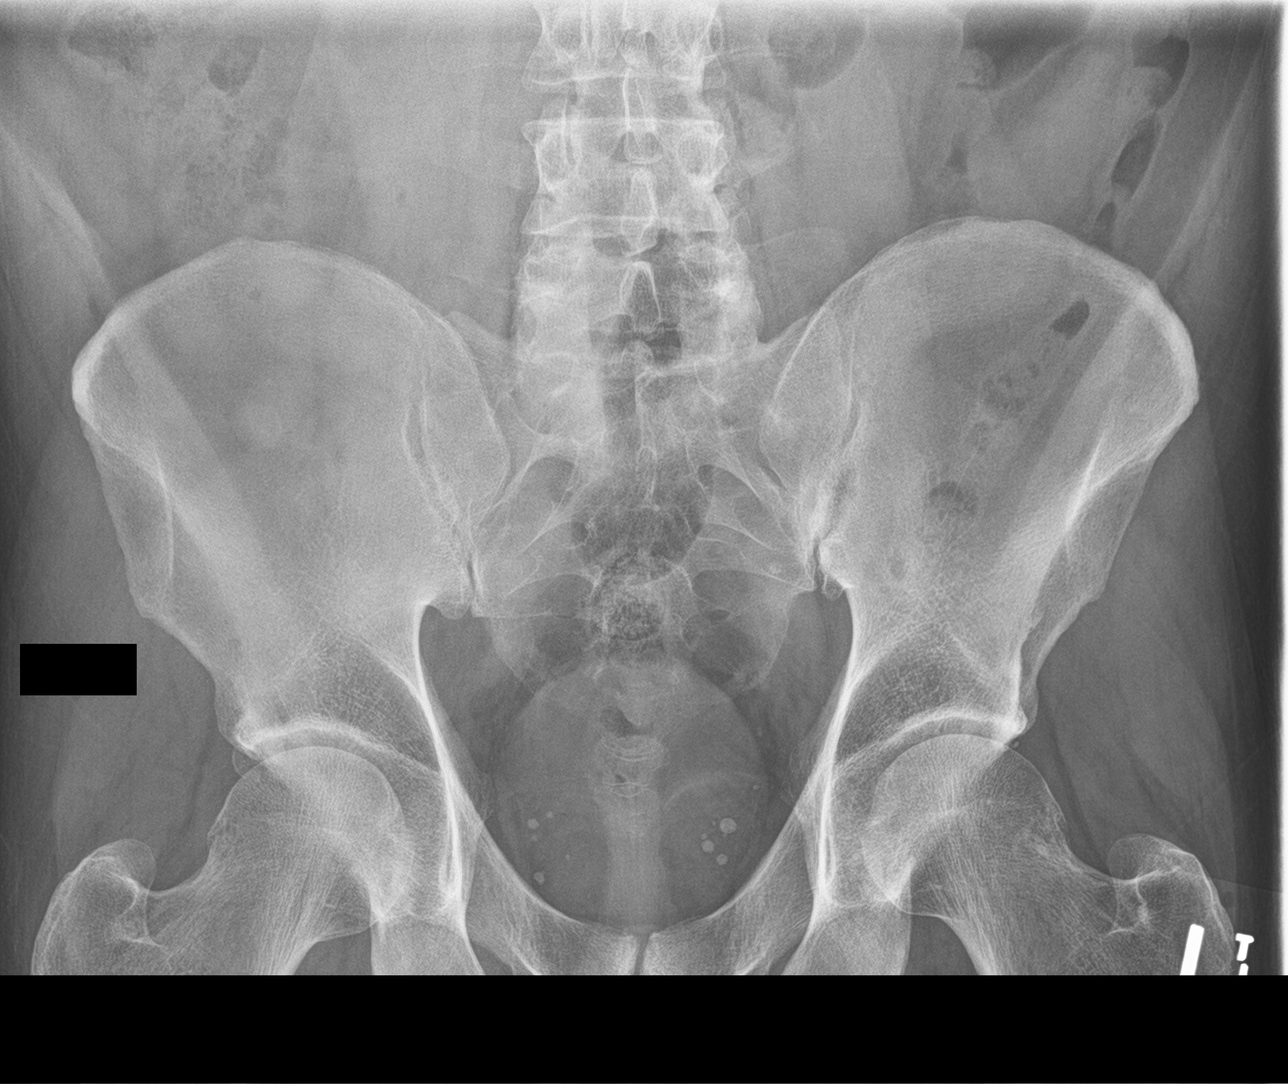
[im 2/4]
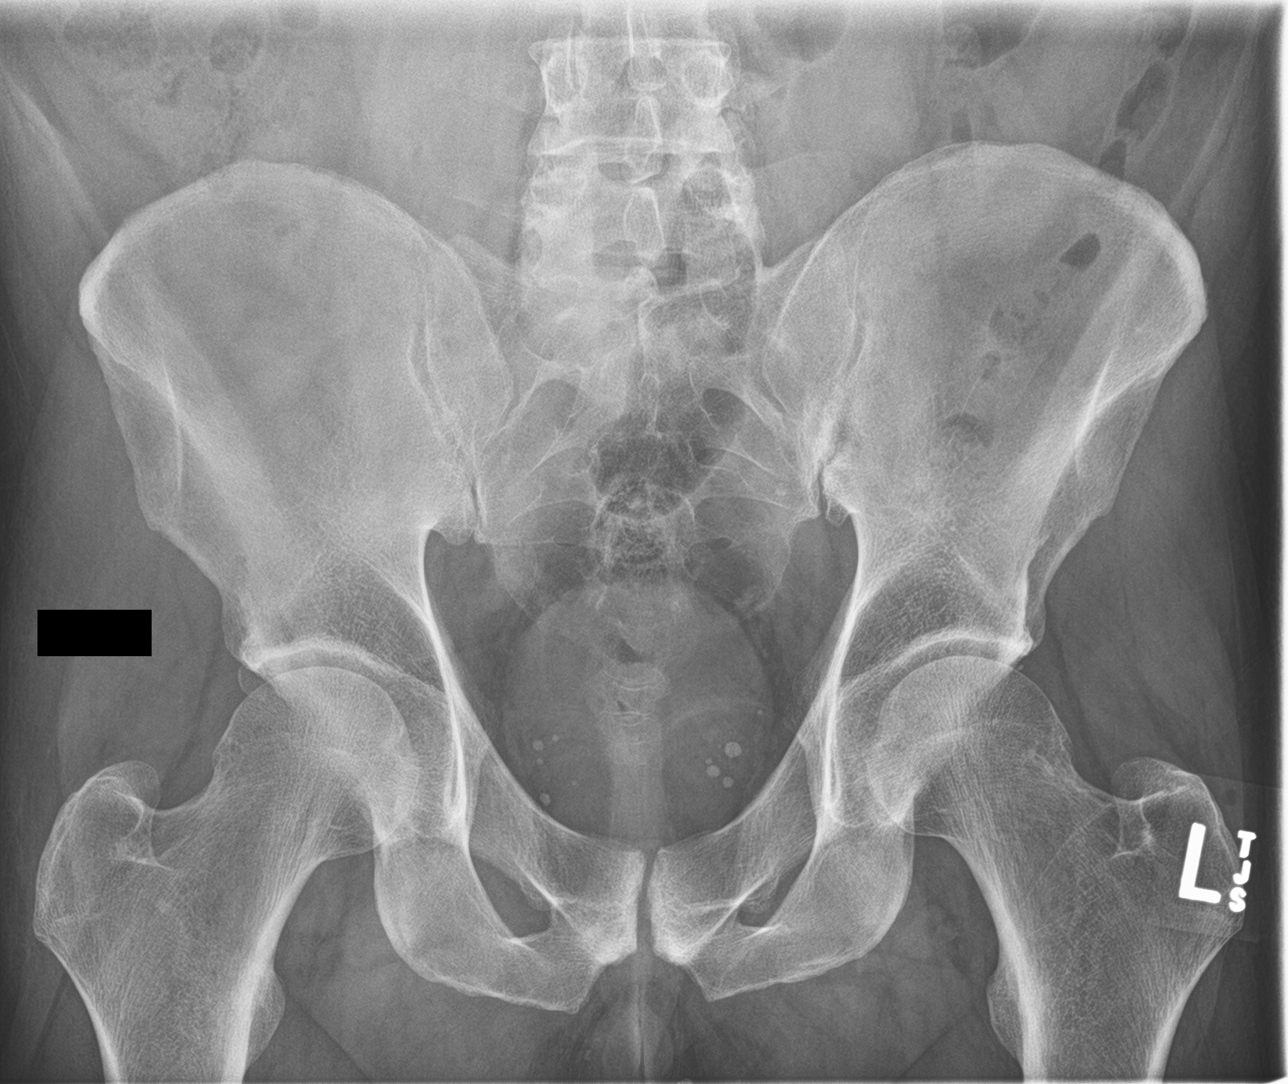
[im 3/4]
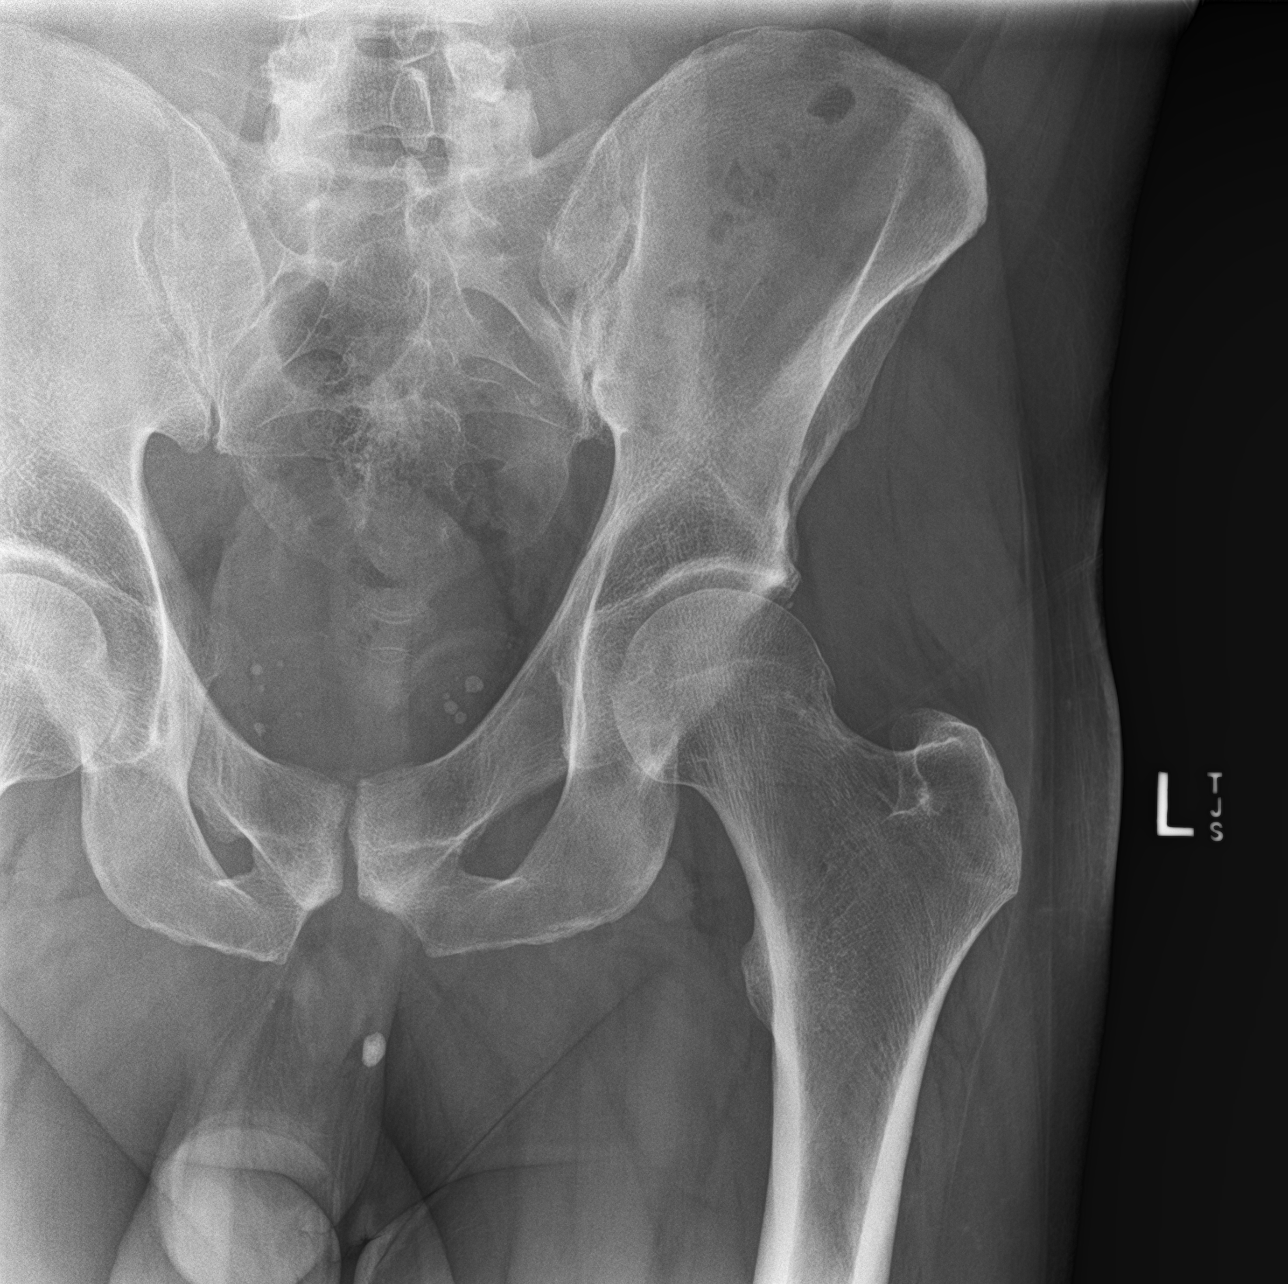
[im 4/4]
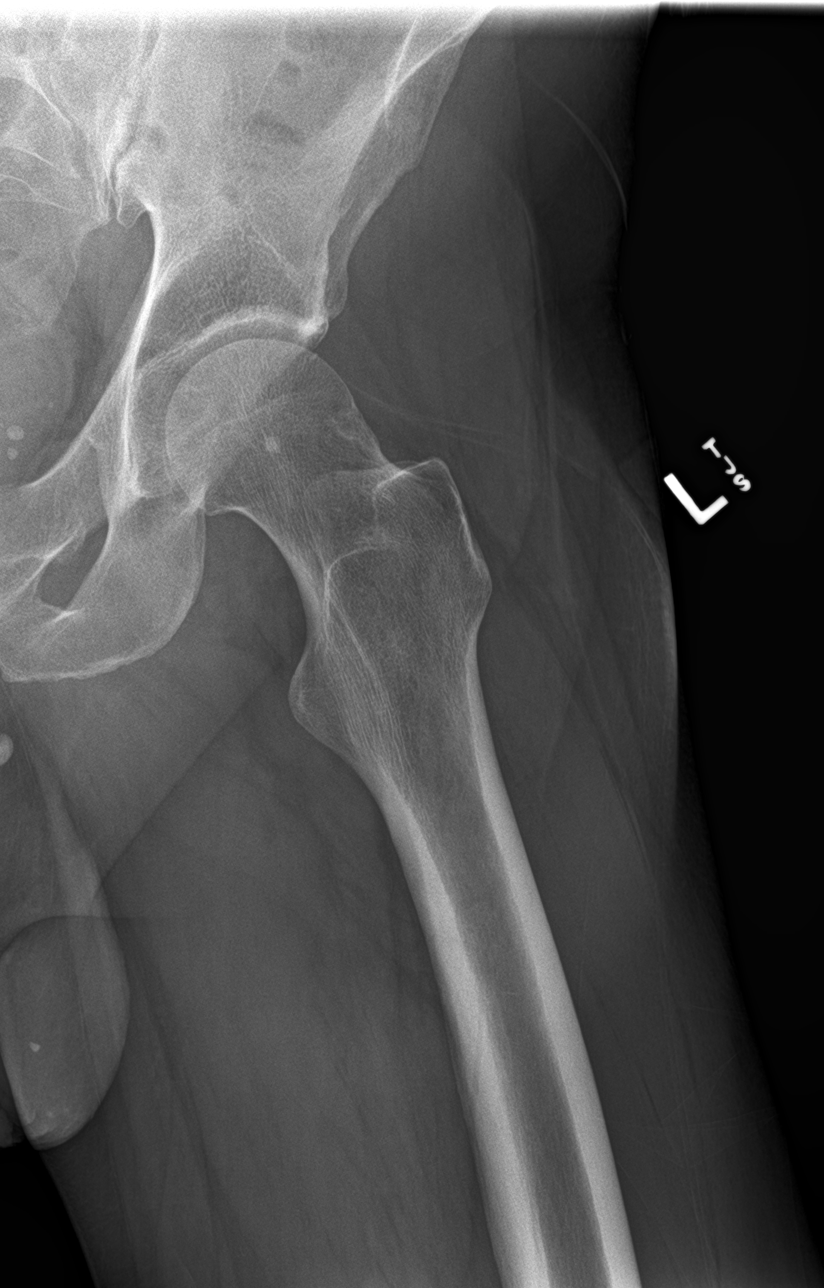

[4 of 4 positions shown; findings below may reference images not displayed]

FINDINGS: There is no acute fracture or subluxation. No radiopaque foreign
body or soft tissue gas. Note is made of sclerosis in the region of
the symphysis pubis, consistent with osteitis pubis.
IMPRESSION: No acute abnormality.

Osteitis pubis.

## 2023-10-21 ENCOUNTER — Other Ambulatory Visit: Payer: Self-pay | Admitting: Family Medicine

## 2023-10-21 DIAGNOSIS — Z0000 Encounter for general adult medical examination without abnormal findings: Secondary | ICD-10-CM

## 2023-10-21 DIAGNOSIS — Z9189 Other specified personal risk factors, not elsewhere classified: Secondary | ICD-10-CM

## 2023-11-11 ENCOUNTER — Ambulatory Visit
Admission: RE | Admit: 2023-11-11 | Discharge: 2023-11-11 | Disposition: A | Discharge status: Home / Self Care | Payer: Self-pay | Source: Ambulatory Visit | Attending: Family Medicine | Admitting: Family Medicine

## 2023-11-11 DIAGNOSIS — Z9189 Other specified personal risk factors, not elsewhere classified: Secondary | ICD-10-CM | POA: Insufficient documentation

## 2023-11-11 DIAGNOSIS — Z0000 Encounter for general adult medical examination without abnormal findings: Secondary | ICD-10-CM | POA: Insufficient documentation
